# Patient Record
Sex: Female | Born: 1975 | Race: Black or African American | Hispanic: No | Marital: Single | Smoking: Current every day smoker
Health system: Southern US, Community
[De-identification: ages and names within clinical notes are randomized; demographics above are authoritative.]

## PROBLEM LIST (undated history)

## (undated) ENCOUNTER — Ambulatory Visit (HOSPITAL_COMMUNITY): Source: Home / Self Care

## (undated) DIAGNOSIS — J45909 Unspecified asthma, uncomplicated: Secondary | ICD-10-CM

## (undated) DIAGNOSIS — M1990 Unspecified osteoarthritis, unspecified site: Secondary | ICD-10-CM

---

## 1997-10-06 ENCOUNTER — Inpatient Hospital Stay (HOSPITAL_COMMUNITY): Admission: AD | Admit: 1997-10-06 | Discharge: 1997-10-06 | Payer: Self-pay | Admitting: *Deleted

## 1997-10-11 ENCOUNTER — Ambulatory Visit (HOSPITAL_COMMUNITY): Admission: RE | Admit: 1997-10-11 | Discharge: 1997-10-11 | Payer: Self-pay | Admitting: Obstetrics & Gynecology

## 1997-12-29 ENCOUNTER — Inpatient Hospital Stay (HOSPITAL_COMMUNITY): Admission: AD | Admit: 1997-12-29 | Discharge: 1997-12-29 | Payer: Self-pay | Admitting: Obstetrics & Gynecology

## 1998-01-18 ENCOUNTER — Emergency Department (HOSPITAL_COMMUNITY): Admission: EM | Admit: 1998-01-18 | Discharge: 1998-01-18 | Payer: Self-pay | Admitting: Emergency Medicine

## 1998-01-18 ENCOUNTER — Inpatient Hospital Stay (HOSPITAL_COMMUNITY): Admission: AD | Admit: 1998-01-18 | Discharge: 1998-01-18 | Payer: Self-pay | Admitting: Obstetrics

## 1998-04-26 ENCOUNTER — Emergency Department (HOSPITAL_COMMUNITY): Admission: EM | Admit: 1998-04-26 | Discharge: 1998-04-26 | Payer: Self-pay | Admitting: Emergency Medicine

## 1998-06-14 ENCOUNTER — Inpatient Hospital Stay (HOSPITAL_COMMUNITY): Admission: AD | Admit: 1998-06-14 | Discharge: 1998-06-14 | Payer: Self-pay | Admitting: Obstetrics

## 1998-07-10 ENCOUNTER — Ambulatory Visit (HOSPITAL_COMMUNITY): Admission: RE | Admit: 1998-07-10 | Discharge: 1998-07-10 | Payer: Self-pay | Admitting: Obstetrics

## 1998-08-14 ENCOUNTER — Inpatient Hospital Stay (HOSPITAL_COMMUNITY): Admission: AD | Admit: 1998-08-14 | Discharge: 1998-08-14 | Payer: Self-pay | Admitting: *Deleted

## 1998-09-06 ENCOUNTER — Ambulatory Visit (HOSPITAL_COMMUNITY): Admission: RE | Admit: 1998-09-06 | Discharge: 1998-09-06 | Payer: Self-pay | Admitting: *Deleted

## 1998-10-16 ENCOUNTER — Inpatient Hospital Stay (HOSPITAL_COMMUNITY): Admission: AD | Admit: 1998-10-16 | Discharge: 1998-10-16 | Payer: Self-pay | Admitting: Obstetrics & Gynecology

## 1999-01-05 ENCOUNTER — Inpatient Hospital Stay (HOSPITAL_COMMUNITY): Admission: RE | Admit: 1999-01-05 | Discharge: 1999-01-05 | Payer: Self-pay | Admitting: *Deleted

## 1999-01-07 ENCOUNTER — Inpatient Hospital Stay (HOSPITAL_COMMUNITY): Admission: AD | Admit: 1999-01-07 | Discharge: 1999-01-07 | Payer: Self-pay | Admitting: Obstetrics

## 1999-01-08 ENCOUNTER — Encounter (INDEPENDENT_AMBULATORY_CARE_PROVIDER_SITE_OTHER): Payer: Self-pay | Admitting: Specialist

## 1999-01-08 ENCOUNTER — Inpatient Hospital Stay (HOSPITAL_COMMUNITY): Admission: AD | Admit: 1999-01-08 | Discharge: 1999-01-12 | Payer: Self-pay | Admitting: Obstetrics

## 1999-01-16 ENCOUNTER — Inpatient Hospital Stay (HOSPITAL_COMMUNITY): Admission: AD | Admit: 1999-01-16 | Discharge: 1999-01-16 | Payer: Self-pay | Admitting: *Deleted

## 1999-10-15 ENCOUNTER — Emergency Department (HOSPITAL_COMMUNITY): Admission: EM | Admit: 1999-10-15 | Discharge: 1999-10-15 | Payer: Self-pay | Admitting: Emergency Medicine

## 1999-10-15 ENCOUNTER — Encounter: Payer: Self-pay | Admitting: Emergency Medicine

## 2001-12-14 ENCOUNTER — Emergency Department (HOSPITAL_COMMUNITY): Admission: EM | Admit: 2001-12-14 | Discharge: 2001-12-14 | Payer: Self-pay | Admitting: Emergency Medicine

## 2002-07-13 ENCOUNTER — Emergency Department (HOSPITAL_COMMUNITY): Admission: EM | Admit: 2002-07-13 | Discharge: 2002-07-13 | Payer: Self-pay | Admitting: Emergency Medicine

## 2004-08-02 ENCOUNTER — Emergency Department (HOSPITAL_COMMUNITY): Admission: EM | Admit: 2004-08-02 | Discharge: 2004-08-02 | Payer: Self-pay | Admitting: Emergency Medicine

## 2007-04-30 ENCOUNTER — Emergency Department (HOSPITAL_COMMUNITY): Admission: EM | Admit: 2007-04-30 | Discharge: 2007-04-30 | Payer: Self-pay | Admitting: Emergency Medicine

## 2008-05-19 ENCOUNTER — Emergency Department (HOSPITAL_COMMUNITY): Admission: EM | Admit: 2008-05-19 | Discharge: 2008-05-19 | Payer: Self-pay | Admitting: Emergency Medicine

## 2008-06-29 ENCOUNTER — Inpatient Hospital Stay (HOSPITAL_COMMUNITY): Admission: AD | Admit: 2008-06-29 | Discharge: 2008-06-29 | Payer: Self-pay | Admitting: Obstetrics & Gynecology

## 2008-08-26 ENCOUNTER — Ambulatory Visit (HOSPITAL_COMMUNITY): Admission: RE | Admit: 2008-08-26 | Discharge: 2008-08-26 | Payer: Self-pay | Admitting: Obstetrics

## 2008-09-29 ENCOUNTER — Inpatient Hospital Stay (HOSPITAL_COMMUNITY): Admission: AD | Admit: 2008-09-29 | Discharge: 2008-09-29 | Payer: Self-pay | Admitting: Obstetrics

## 2008-09-29 ENCOUNTER — Ambulatory Visit: Payer: Self-pay | Admitting: Obstetrics and Gynecology

## 2008-11-06 ENCOUNTER — Inpatient Hospital Stay (HOSPITAL_COMMUNITY): Admission: AD | Admit: 2008-11-06 | Discharge: 2008-11-06 | Payer: Self-pay | Admitting: Obstetrics

## 2008-11-06 ENCOUNTER — Ambulatory Visit: Payer: Self-pay | Admitting: Advanced Practice Midwife

## 2008-12-19 ENCOUNTER — Inpatient Hospital Stay (HOSPITAL_COMMUNITY): Admission: AD | Admit: 2008-12-19 | Discharge: 2008-12-20 | Payer: Self-pay | Admitting: Obstetrics

## 2009-01-19 ENCOUNTER — Inpatient Hospital Stay (HOSPITAL_COMMUNITY): Admission: AD | Admit: 2009-01-19 | Discharge: 2009-01-21 | Payer: Self-pay | Admitting: Obstetrics

## 2009-01-30 ENCOUNTER — Inpatient Hospital Stay (HOSPITAL_COMMUNITY): Admission: AD | Admit: 2009-01-30 | Discharge: 2009-02-02 | Payer: Self-pay | Admitting: Obstetrics

## 2010-10-28 LAB — COMPREHENSIVE METABOLIC PANEL
ALT: 11 U/L (ref 0–35)
AST: 15 U/L (ref 0–37)
AST: 17 U/L (ref 0–37)
AST: 18 U/L (ref 0–37)
Albumin: 3 g/dL — ABNORMAL LOW (ref 3.5–5.2)
Albumin: 3.1 g/dL — ABNORMAL LOW (ref 3.5–5.2)
Albumin: 3.2 g/dL — ABNORMAL LOW (ref 3.5–5.2)
Alkaline Phosphatase: 157 U/L — ABNORMAL HIGH (ref 39–117)
Alkaline Phosphatase: 85 U/L (ref 39–117)
BUN: 11 mg/dL (ref 6–23)
BUN: 4 mg/dL — ABNORMAL LOW (ref 6–23)
CO2: 24 mEq/L (ref 19–32)
CO2: 26 mEq/L (ref 19–32)
Calcium: 8.6 mg/dL (ref 8.4–10.5)
Calcium: 8.7 mg/dL (ref 8.4–10.5)
Chloride: 105 mEq/L (ref 96–112)
Chloride: 105 mEq/L (ref 96–112)
Creatinine, Ser: 0.43 mg/dL (ref 0.4–1.2)
Creatinine, Ser: 0.63 mg/dL (ref 0.4–1.2)
Creatinine, Ser: 0.72 mg/dL (ref 0.4–1.2)
GFR calc Af Amer: >60 mL/min (ref >60)
GFR calc Af Amer: >60 mL/min (ref >60)
GFR calc Af Amer: >60 mL/min (ref >60)
GFR calc non Af Amer: >60 mL/min (ref >60)
GFR calc non Af Amer: >60 mL/min (ref >60)
GFR calc non Af Amer: >60 mL/min (ref >60)
Glucose, Bld: 88 mg/dL (ref 70–99)
Potassium: 2.9 mEq/L — ABNORMAL LOW (ref 3.5–5.1)
Potassium: 3.1 mEq/L — ABNORMAL LOW (ref 3.5–5.1)
Sodium: 138 mEq/L (ref 135–145)
Total Bilirubin: 0.5 mg/dL (ref 0.3–1.2)
Total Bilirubin: 0.5 mg/dL (ref 0.3–1.2)
Total Protein: 6.6 g/dL (ref 6.0–8.3)

## 2010-10-28 LAB — CBC
HCT: 28.9 % — ABNORMAL LOW (ref 36.0–46.0)
HCT: 31.2 % — ABNORMAL LOW (ref 36.0–46.0)
HCT: 31.3 % — ABNORMAL LOW (ref 36.0–46.0)
Hemoglobin: 10.7 g/dL — ABNORMAL LOW (ref 12.0–15.0)
Hemoglobin: 9.7 g/dL — ABNORMAL LOW (ref 12.0–15.0)
MCHC: 33.1 g/dL (ref 30.0–36.0)
MCHC: 33.8 g/dL (ref 30.0–36.0)
MCHC: 34.2 g/dL (ref 30.0–36.0)
MCV: 85.8 fL (ref 78.0–100.0)
MCV: 85.8 fL (ref 78.0–100.0)
MCV: 86.2 fL (ref 78.0–100.0)
MCV: 87 fL (ref 78.0–100.0)
Platelets: 269 10*3/uL (ref 150–400)
Platelets: 547 10*3/uL — ABNORMAL HIGH (ref 150–400)
RBC: 3.64 MIL/uL — ABNORMAL LOW (ref 3.87–5.11)
RBC: 3.65 MIL/uL — ABNORMAL LOW (ref 3.87–5.11)
RDW: 14.7 % (ref 11.5–15.5)
RDW: 14.7 % (ref 11.5–15.5)
WBC: 7.8 10*3/uL (ref 4.0–10.5)
WBC: 9.9 10*3/uL (ref 4.0–10.5)

## 2010-10-28 LAB — URIC ACID
Uric Acid, Serum: 4 mg/dL (ref 2.4–7.0)
Uric Acid, Serum: 6 mg/dL (ref 2.4–7.0)
Uric Acid, Serum: 6.5 mg/dL (ref 2.4–7.0)

## 2010-10-31 LAB — URINALYSIS, ROUTINE W REFLEX MICROSCOPIC
Bilirubin Urine: NEGATIVE
Glucose, UA: NEGATIVE mg/dL
Hgb urine dipstick: NEGATIVE
Ketones, ur: NEGATIVE mg/dL
Nitrite: POSITIVE — AB
Specific Gravity, Urine: 1.01 (ref 1.005–1.030)
pH: 7 (ref 5.0–8.0)

## 2010-10-31 LAB — URINE CULTURE: Colony Count: >=100000

## 2010-10-31 LAB — URINE MICROSCOPIC-ADD ON

## 2010-10-31 LAB — GC/CHLAMYDIA PROBE AMP, GENITAL
Chlamydia, DNA Probe: NEGATIVE
GC Probe Amp, Genital: NEGATIVE

## 2010-10-31 LAB — WET PREP, GENITAL: Trich, Wet Prep: NONE SEEN

## 2010-12-04 NOTE — Discharge Summary (Signed)
NAMEBRYAR, Stephanie             ACCOUNT NO.:  [id number]   MEDICAL RECORD NO.:  [id number]          PATIENT TYPE:  INP   LOCATION:  9124                          FACILITY:  [facility]   PHYSICIAN:  Kathreen Cosier, M.D.DATE OF BIRTH:  [birth year]   DATE OF ADMISSION:  01/19/2009  DATE OF DISCHARGE:  01/21/2009                               DISCHARGE SUMMARY   The patient is a 35 year old gravida 5, para 2-0-2-2, [facility] January 19, 2009.  She had previous C-section and negative GBS.  She was admitted in labor  and she was 6 cm, 90% vertex, -2 to -3.  She underwent a repeat low  transverse cesarean section because of failure to progress in labor and  had a female Apgar of 9 and 9, weighing 7 pounds from the OP position.  Fluid was meconium stained.  Postoperatively, she did well and on  admission hemoglobin 10.7, postop 9.7, platelets 269, 247.  RPR  negative.  HIV negative.  The patient wanted early discharge and was  discharged on the second postoperative day, ambulatory on a regular  diet, to see me in 6 weeks.   DISCHARGE DIAGNOSIS:  Status post repeat low-transverse cesarean section  at term for failure to progress in labor.           ______________________________  Kathreen Cosier, M.D.     BAM/MEDQ  D:  02/01/2009  T:  02/01/2009  Job:  [id number]

## 2010-12-04 NOTE — H&P (Signed)
NAMECICILY, Stephanie Richards             ACCOUNT NO.:  [id number]   MEDICAL RECORD NO.:  [id number]          PATIENT TYPE:  INP   LOCATION:  9124                          FACILITY:  [facility]   PHYSICIAN:  Kathreen Cosier, M.D.DATE OF BIRTH:  [birth year]   DATE OF ADMISSION:  01/19/2009  DATE OF DISCHARGE:                              HISTORY & PHYSICAL   The patient is a 35 year old gravida 5, para 2-0-2-2, [facility] is January 19, 2009, had a C-section for the number 2 pregnancy, negative GBS.  She was  admitted in labor.  At 9 a.m., cervix was 6 cm, 90% vertex, -2, -3.  Amniotomy was performed.  Fluid was meconium stained.  An IUPC was  inserted.  The patient was inadequate labor throughout the course of the  day.  By 6:35 p.m., her cervical findings had been unchanged.  It was  decided she will deliver by a repeat C-section for failure to progress  in labor.   PHYSICAL EXAMINATION:  GENERAL:  A well-developed female, in labor.  HEENT:  Negative.  LUNGS:  Clear.  HEART:  Regular rhythm.  No murmurs, no gallops.  BREASTS:  No masses.  ABDOMEN:  Term-sized uterus.  Estimated fetal weight of 7 pounds.  PELVIC:  As described above.  EXTREMITIES:  Negative.           ______________________________  Kathreen Cosier, M.D.     BAM/MEDQ  D:  01/19/2009  T:  01/20/2009  Job:  [id number]

## 2010-12-04 NOTE — Op Note (Signed)
Stephanie Richards, Stephanie Richards             ACCOUNT NO.:  [id number]   MEDICAL RECORD NO.:  [id number]          PATIENT TYPE:  INP   LOCATION:  9124                          FACILITY:  [facility]   PHYSICIAN:  Kathreen Cosier, M.D.DATE OF BIRTH:  1975-11-17   DATE OF PROCEDURE:  01/19/2009  DATE OF DISCHARGE:                               OPERATIVE REPORT   PREOPERATIVE DIAGNOSES:  Previous cesarean section at term and labor,  failure to progress in labor, repeat low-transverse cesarean section.   POSTOPERATIVE DIAGNOSES:  Previous cesarean section at term and labor,  failure to progress with labor, repeat low-transverse cesarean section.   SURGEON:  Kathreen Cosier, MD   ANESTHESIA:  Epidural.   PROCEDURE:  The patient placed in the operating table in the supine  position.  Abdomen prepped and draped.  Bladder emptied with the Foley  catheter.  Transverse suprapubic incision made through old scar, carried  down to rectus fascia.  Fascia cleaned and incised to the length of the  incision.  Recti muscles were retracted laterally.  Peritoneum was  incised longitudinally.  Transverse incision was made in the visceral  peritoneum above the bladder.  Bladder mobilized inferiorly.  Transverse  lower uterine incision was made.  The patient delivered from the OP  position of a female.  Apgar 9 and 9, weighing 7 pounds.  The team was  in attendance.  The fluid was meconium stained.  The placenta was  posterior and removed manually.  The uterine cavity cleaned with dry  laps. The uterine incision closed in 1 layer with continuous suture of  #1 Chromic.  Hemostasis was satisfactory.  Bladder flap reattached with  2-0 Chromic.  Uterus were contracted.  Tubes and ovaries were normal.  Abdomen was closed in layers.  Peritoneum continuous suture of 0  chromic, fascia continuous suture of 0 Dexon, and the skin closed with  subcuticular stitch of 4-0 Monocryl.  Blood loss 500 cc.  The patient  tolerated  the procedure well, and taken to the recovery room in good  condition.           ______________________________  Kathreen Cosier, M.D.     BAM/MEDQ  D:  01/19/2009  T:  01/20/2009  Job:  [id number]

## 2011-04-26 LAB — URINALYSIS, ROUTINE W REFLEX MICROSCOPIC
Bilirubin Urine: NEGATIVE
Bilirubin Urine: NEGATIVE
Hgb urine dipstick: NEGATIVE
Nitrite: POSITIVE — AB
Nitrite: POSITIVE — AB
Protein, ur: NEGATIVE mg/dL
Specific Gravity, Urine: 1.01 (ref 1.005–1.030)
Specific Gravity, Urine: 1.015 (ref 1.005–1.030)
Urobilinogen, UA: 0.2 mg/dL (ref 0.0–1.0)
Urobilinogen, UA: 0.2 mg/dL (ref 0.0–1.0)
pH: 7 (ref 5.0–8.0)

## 2011-04-26 LAB — URINE MICROSCOPIC-ADD ON

## 2011-04-26 LAB — WET PREP, GENITAL: Yeast Wet Prep HPF POC: NONE SEEN

## 2011-04-26 LAB — URINE CULTURE

## 2011-04-26 LAB — CBC
Hemoglobin: 11.4 g/dL — ABNORMAL LOW (ref 12.0–15.0)
MCHC: 33.8 g/dL (ref 30.0–36.0)
RBC: 3.87 MIL/uL (ref 3.87–5.11)
WBC: 7.4 10*3/uL (ref 4.0–10.5)

## 2011-04-26 LAB — RPR: RPR Ser Ql: NONREACTIVE

## 2011-04-26 LAB — POCT PREGNANCY, URINE: Preg Test, Ur: POSITIVE

## 2011-04-26 LAB — HCG, QUANTITATIVE, PREGNANCY: hCG, Beta Chain, Quant, S: 91079 m[IU]/mL — ABNORMAL HIGH (ref <5)

## 2011-04-26 LAB — ABO/RH: ABO/RH(D): AB POS

## 2011-05-02 LAB — POCT URINALYSIS DIP (DEVICE)
Glucose, UA: NEGATIVE
Ketones, ur: NEGATIVE
Operator id: 239701
Protein, ur: NEGATIVE
Urobilinogen, UA: 0.2

## 2011-05-02 LAB — POCT PREGNANCY, URINE
Operator id: 247071
Preg Test, Ur: NEGATIVE

## 2011-11-05 ENCOUNTER — Other Ambulatory Visit: Payer: Self-pay | Admitting: Obstetrics

## 2012-11-07 ENCOUNTER — Emergency Department (HOSPITAL_COMMUNITY)
Admission: EM | Admit: 2012-11-07 | Discharge: 2012-11-07 | Disposition: A | Discharge status: Home / Self Care | Payer: No Typology Code available for payment source | Attending: Emergency Medicine | Admitting: Emergency Medicine

## 2012-11-07 ENCOUNTER — Encounter (HOSPITAL_COMMUNITY): Payer: Self-pay | Admitting: Emergency Medicine

## 2012-11-07 DIAGNOSIS — IMO0002 Reserved for concepts with insufficient information to code with codable children: Secondary | ICD-10-CM | POA: Insufficient documentation

## 2012-11-07 DIAGNOSIS — Y92410 Unspecified street and highway as the place of occurrence of the external cause: Secondary | ICD-10-CM | POA: Insufficient documentation

## 2012-11-07 DIAGNOSIS — F17200 Nicotine dependence, unspecified, uncomplicated: Secondary | ICD-10-CM | POA: Insufficient documentation

## 2012-11-07 DIAGNOSIS — S39012A Strain of muscle, fascia and tendon of lower back, initial encounter: Secondary | ICD-10-CM

## 2012-11-07 DIAGNOSIS — Y9389 Activity, other specified: Secondary | ICD-10-CM | POA: Insufficient documentation

## 2012-11-07 MED ORDER — CYCLOBENZAPRINE HCL 10 MG PO TABS: 10.0000 mg | ORAL_TABLET | Freq: Three times a day (TID) | ORAL | Status: DC | PRN

## 2012-11-07 MED ORDER — NAPROXEN 375 MG PO TABS: 375.0000 mg | ORAL_TABLET | Freq: Two times a day (BID) | ORAL | Status: DC | PRN

## 2012-11-07 MED ORDER — IBUPROFEN 200 MG PO TABS
600.0000 mg | ORAL_TABLET | Freq: Once | ORAL | Status: AC
Start: 2012-11-07 — End: 2012-11-07
  Administered 2012-11-07: 600 mg via ORAL
  Filled 2012-11-07: qty 3
  Filled 2012-11-07: qty 1

## 2012-11-07 NOTE — ED Notes (Signed)
One 200-mg tab of ibuprofen dropped on the floor. Went to med room and got another one 200-mg tab.
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
## 2012-11-27 ENCOUNTER — Emergency Department (HOSPITAL_COMMUNITY)
Admission: EM | Admit: 2012-11-27 | Discharge: 2012-11-28 | Disposition: A | Discharge status: Home / Self Care | Payer: Medicaid Other | Attending: Emergency Medicine | Admitting: Emergency Medicine

## 2012-11-27 ENCOUNTER — Encounter (HOSPITAL_COMMUNITY): Payer: Self-pay

## 2012-11-27 DIAGNOSIS — R509 Fever, unspecified: Secondary | ICD-10-CM | POA: Insufficient documentation

## 2012-11-27 DIAGNOSIS — R1310 Dysphagia, unspecified: Secondary | ICD-10-CM | POA: Insufficient documentation

## 2012-11-27 DIAGNOSIS — IMO0001 Reserved for inherently not codable concepts without codable children: Secondary | ICD-10-CM | POA: Insufficient documentation

## 2012-11-27 DIAGNOSIS — F17200 Nicotine dependence, unspecified, uncomplicated: Secondary | ICD-10-CM | POA: Insufficient documentation

## 2012-11-27 DIAGNOSIS — J020 Streptococcal pharyngitis: Secondary | ICD-10-CM | POA: Insufficient documentation

## 2012-11-27 LAB — RAPID STREP SCREEN (MED CTR MEBANE ONLY): Streptococcus, Group A Screen (Direct): POSITIVE — AB

## 2012-11-27 MED ORDER — PENICILLIN G BENZATHINE 1200000 UNIT/2ML IM SUSP
1.2000 10*6.[IU] | Freq: Once | INTRAMUSCULAR | Status: AC
  Administered 2012-11-27: 1.2 10*6.[IU] via INTRAMUSCULAR
  Filled 2012-11-27: qty 2

## 2012-11-27 MED ORDER — IBUPROFEN 600 MG PO TABS: 600.0000 mg | ORAL_TABLET | Freq: Four times a day (QID) | ORAL | Status: DC | PRN

## 2012-11-27 MED ORDER — ACETAMINOPHEN 325 MG PO TABS
650.0000 mg | ORAL_TABLET | Freq: Once | ORAL | Status: AC
  Administered 2012-11-27: 650 mg via ORAL
  Filled 2012-11-27: qty 2

## 2012-11-27 NOTE — ED Provider Notes (Signed)
History     CSN: [id number]  Arrival date & time 11/27/12  2129   First MD Initiated Contact with Patient 11/27/12 2255      Chief Complaint  Patient presents with  . Sore Throat    (Consider location/radiation/quality/duration/timing/severity/associated sxs/prior treatment) HPI Comments: Patient started with sore throat, myalgias, and fever.  Yesterday.  She has been in known contact with positive strep.  She is taking over-the-counter Tylenol with little relief  Patient is a 37 y.o. female presenting with pharyngitis. The history is provided by the patient.  Sore Throat This is a new problem. The current episode started yesterday. The problem has been unchanged. Associated symptoms include a fever, myalgias and a sore throat. Pertinent negatives include no chills, headaches, nausea or neck pain. The symptoms are aggravated by swallowing. She has tried nothing for the symptoms.    History reviewed. No pertinent past medical history.  History reviewed. No pertinent past surgical history.  History reviewed. No pertinent family history.  History  Substance Use Topics  . Smoking status: Current Every Day Smoker -- 0.50 packs/day    Types: Cigarettes  . Smokeless tobacco: Not on file  . Alcohol Use: Yes    OB History   Grav Para Term Preterm Abortions TAB SAB Ect Mult Living                  Review of Systems  Constitutional: Positive for fever. Negative for chills.  HENT: Positive for sore throat and trouble swallowing. Negative for rhinorrhea and neck pain.   Gastrointestinal: Negative for nausea.  Musculoskeletal: Positive for myalgias.  Neurological: Negative for dizziness and headaches.  All other systems reviewed and are negative.    Allergies  Review of patient's allergies indicates no known allergies.  Home Medications   Current Outpatient Rx  Name  Route  Sig  Dispense  Refill  . cyclobenzaprine (FLEXERIL) 10 MG tablet   Oral   Take 1 tablet (10 mg  total) by mouth 3 (three) times daily as needed for muscle spasms.   12 tablet   0   . MedroxyPROGESTERone Acetate (DEPO-PROVERA IM)   Intramuscular   Inject into the muscle every 3 (three) months.         Marland Kitchen ibuprofen (ADVIL,MOTRIN) 600 MG tablet   Oral   Take 1 tablet (600 mg total) by mouth every 6 (six) hours as needed for pain.   30 tablet   0     BP 114/61  Pulse 104  Temp(Src) 101.8 F (38.8 C) (Oral)  Resp 20  SpO2 96%  Physical Exam  Vitals reviewed. Constitutional: She is oriented to person, place, and time. She appears well-developed and well-nourished.  HENT:  Head: Normocephalic and atraumatic.  Right Ear: External ear normal.  Left Ear: External ear normal.  Mouth/Throat: Edematous present. Oropharyngeal exudate and posterior oropharyngeal erythema present. No tonsillar abscesses.  Eyes: Pupils are equal, round, and reactive to light.  Cardiovascular: Regular rhythm.  Tachycardia present.   Pulmonary/Chest: Effort normal and breath sounds normal.  Musculoskeletal: Normal range of motion.  Lymphadenopathy:    She has cervical adenopathy.  Neurological: She is alert and oriented to person, place, and time.  Skin: Skin is warm and dry. No rash noted.    ED Course  Procedures (including critical care time)  Labs Reviewed  RAPID STREP SCREEN - Abnormal; Notable for the following:    Streptococcus, Group A Screen (Direct) POSITIVE (*)    All other components within  normal limits   No results found.   1. Strep pharyngitis       MDM  Patient.  Positive for strep, be given IM injection of 1.2 million units of LA Bicillin, recheck, temperature       Arman Filter, NP 11/27/12 2348  Arman Filter, NP 11/27/12 2348

## 2012-11-27 NOTE — ED Notes (Signed)
Pt c/o sore throat starting last night, getting worse through out the day, increase pain w/swallowing

## 2012-11-28 ENCOUNTER — Emergency Department (HOSPITAL_COMMUNITY)
Admission: EM | Admit: 2012-11-28 | Discharge: 2012-11-29 | Disposition: A | Discharge status: Home / Self Care | Payer: Medicaid Other | Attending: Emergency Medicine | Admitting: Emergency Medicine

## 2012-11-28 DIAGNOSIS — J020 Streptococcal pharyngitis: Secondary | ICD-10-CM | POA: Insufficient documentation

## 2012-11-28 DIAGNOSIS — R599 Enlarged lymph nodes, unspecified: Secondary | ICD-10-CM | POA: Insufficient documentation

## 2012-11-28 DIAGNOSIS — F17200 Nicotine dependence, unspecified, uncomplicated: Secondary | ICD-10-CM | POA: Insufficient documentation

## 2012-11-28 DIAGNOSIS — R000 Tachycardia, unspecified: Secondary | ICD-10-CM | POA: Insufficient documentation

## 2012-11-28 DIAGNOSIS — Z79899 Other long term (current) drug therapy: Secondary | ICD-10-CM | POA: Insufficient documentation

## 2012-11-28 MED ORDER — SODIUM CHLORIDE 0.9 % IV BOLUS (SEPSIS)
1000.0000 mL | Freq: Once | INTRAVENOUS | Status: AC
  Administered 2012-11-28: 1000 mL via INTRAVENOUS

## 2012-11-28 MED ORDER — LIDOCAINE VISCOUS 2 % MT SOLN
20.0000 mL | Freq: Once | OROMUCOSAL | Status: AC
  Administered 2012-11-28: 20 mL via OROMUCOSAL
  Filled 2012-11-28: qty 15

## 2012-11-28 MED ORDER — KETOROLAC TROMETHAMINE 30 MG/ML IJ SOLN
30.0000 mg | Freq: Once | INTRAMUSCULAR | Status: AC
  Administered 2012-11-29: 30 mg via INTRAVENOUS
  Filled 2012-11-28: qty 1

## 2012-11-28 MED ORDER — GI COCKTAIL ~~LOC~~
30.0000 mL | Freq: Once | ORAL | Status: DC
  Filled 2012-11-28: qty 30

## 2012-11-28 MED ORDER — HYDROCODONE-ACETAMINOPHEN 7.5-325 MG/15ML PO SOLN: 15.0000 mL | Freq: Four times a day (QID) | ORAL | Status: DC | PRN

## 2012-11-28 MED ORDER — DIPHENHYDRAMINE HCL 12.5 MG/5ML PO ELIX
25.0000 mg | ORAL_SOLUTION | Freq: Once | ORAL | Status: DC
  Filled 2012-11-28: qty 10

## 2012-11-28 NOTE — ED Notes (Addendum)
Pt states symptoms are the same as yesterday, received prescriptions, has not gotten them filled. Pt has trouble speaking and swallowing.

## 2012-11-28 NOTE — ED Provider Notes (Signed)
  Medical screening examination/treatment/procedure(s) were performed by non-physician practitioner and as supervising physician I was immediately available for consultation/collaboration.    Gerhard Munch, MD 11/28/12 [phone]

## 2012-11-28 NOTE — ED Provider Notes (Signed)
History     CSN: [id number]  Arrival date & time 11/28/12  2139   First MD Initiated Contact with Patient 11/28/12 2155      Chief Complaint  Patient presents with  . Sore Throat   HPI  History provided by the patient and recent medical chart. Patient is a 37 year old female who presents with continued sore throat symptoms. She was seen and evaluated yesterday for similar symptoms. Sore throat began 1-2 days ago. She was found to have a strep throat infection and given a long-acting dose of penicillin IM for treatment yesterday. Since that time she has not taken any other medications for symptoms and complains of continued pain. Pain is worse with any needing or drinking. He is having a difficult time swallowing. Denies any difficulty breathing. No cough symptoms. Denies fever, chills or sweats.    No past medical history on file.  No past surgical history on file.  No family history on file.  History  Substance Use Topics  . Smoking status: Current Every Day Smoker -- 0.50 packs/day    Types: Cigarettes  . Smokeless tobacco: Not on file  . Alcohol Use: Yes    OB History   Grav Para Term Preterm Abortions TAB SAB Ect Mult Living                  Review of Systems  Constitutional: Negative for fever, chills and diaphoresis.  HENT: Positive for sore throat.   Respiratory: Negative for cough.   All other systems reviewed and are negative.    Allergies  Review of patient's allergies indicates no known allergies.  Home Medications   Current Outpatient Rx  Name  Route  Sig  Dispense  Refill  . cyclobenzaprine (FLEXERIL) 10 MG tablet   Oral   Take 1 tablet (10 mg total) by mouth 3 (three) times daily as needed for muscle spasms.   12 tablet   0   . ibuprofen (ADVIL,MOTRIN) 600 MG tablet   Oral   Take 1 tablet (600 mg total) by mouth every 6 (six) hours as needed for pain.   30 tablet   0   . MedroxyPROGESTERone Acetate (DEPO-PROVERA IM)   Intramuscular  Inject into the muscle every 3 (three) months.           BP 113/73  Pulse 112  Temp(Src) 99.7 F (37.6 C) (Oral)  Resp 20  SpO2 96%  Physical Exam  Nursing note and vitals reviewed. Constitutional: She is oriented to person, place, and time. She appears well-developed and well-nourished. No distress.  HENT:  Head: Normocephalic and atraumatic. No trismus in the jaw.  Right Ear: External ear normal.  Left Ear: External ear normal.  Mouth/Throat: Oropharyngeal exudate and posterior oropharyngeal erythema present. No tonsillar abscesses.  Tonsils 4+ bilaterally with exudate. Uvula midline. No signs concerning for PTA.  Eyes: Pupils are equal, round, and reactive to light.  Cardiovascular: Regular rhythm.  Tachycardia present.   Pulmonary/Chest: Effort normal and breath sounds normal. No respiratory distress. She has no wheezes. She has no rales.  Abdominal: Soft.  Musculoskeletal: Normal range of motion.  Lymphadenopathy:    She has cervical adenopathy.  Neurological: She is alert and oriented to person, place, and time.  Skin: Skin is warm and dry. No rash noted.  Psychiatric: She has a normal mood and affect. Her behavior is normal.    ED Course  Procedures     1. Strep throat  MDM  10:10 PM patient seen and evaluated. Patient refusing to take any liquid medications or by mouth challenge. Complains of too much pain in throat. She is slightly tachycardic with poor by mouth intake today. Will give IV fluids.        Angus Seller, PA-C 11/28/12 2352

## 2012-11-28 NOTE — ED Provider Notes (Signed)
Medical screening examination/treatment/procedure(s) were performed by non-physician practitioner and as supervising physician I was immediately available for consultation/collaboration.  Marshea Wisher, MD 11/28/12 0012 

## 2012-11-28 NOTE — ED Notes (Signed)
Pt here for same symptoms that brought her in Friday. Sore throat, Fever, sweating, chills, lower back pain, and productive cough. Denies N/V.

## 2014-01-20 ENCOUNTER — Emergency Department (HOSPITAL_COMMUNITY): Payer: Medicaid Other

## 2014-01-20 ENCOUNTER — Emergency Department (HOSPITAL_COMMUNITY)
Admission: EM | Admit: 2014-01-20 | Discharge: 2014-01-20 | Disposition: A | Discharge status: Home / Self Care | Payer: Self-pay | Attending: Emergency Medicine | Admitting: Emergency Medicine

## 2014-01-20 ENCOUNTER — Encounter (HOSPITAL_COMMUNITY): Payer: Self-pay | Admitting: Emergency Medicine

## 2014-01-20 DIAGNOSIS — Z79899 Other long term (current) drug therapy: Secondary | ICD-10-CM | POA: Insufficient documentation

## 2014-01-20 DIAGNOSIS — F17200 Nicotine dependence, unspecified, uncomplicated: Secondary | ICD-10-CM | POA: Insufficient documentation

## 2014-01-20 DIAGNOSIS — J020 Streptococcal pharyngitis: Secondary | ICD-10-CM | POA: Insufficient documentation

## 2014-01-20 LAB — CBC WITH DIFFERENTIAL/PLATELET
BASOS PCT: 0 % (ref 0–1)
Basophils Absolute: 0 10*3/uL (ref 0.0–0.1)
EOS PCT: 0 % (ref 0–5)
Eosinophils Absolute: 0 10*3/uL (ref 0.0–0.7)
HCT: 38.7 % (ref 36.0–46.0)
HEMOGLOBIN: 13 g/dL (ref 12.0–15.0)
LYMPHS PCT: 8 % — AB (ref 12–46)
Lymphs Abs: 1.7 10*3/uL (ref 0.7–4.0)
MCH: 28.2 pg (ref 26.0–34.0)
MCHC: 33.6 g/dL (ref 30.0–36.0)
MCV: 83.9 fL (ref 78.0–100.0)
MONO ABS: 1.9 10*3/uL — AB (ref 0.1–1.0)
MONOS PCT: 9 % (ref 3–12)
NEUTROS PCT: 83 % — AB (ref 43–77)
Neutro Abs: 17.2 10*3/uL — ABNORMAL HIGH (ref 1.7–7.7)
Platelets: 277 10*3/uL (ref 150–400)
RBC: 4.61 MIL/uL (ref 3.87–5.11)
RDW: 14 % (ref 11.5–15.5)
WBC: 20.8 10*3/uL — AB (ref 4.0–10.5)

## 2014-01-20 LAB — BASIC METABOLIC PANEL
ANION GAP: 19 — AB (ref 5–15)
BUN: 10 mg/dL (ref 6–23)
CHLORIDE: 97 meq/L (ref 96–112)
CO2: 23 meq/L (ref 19–32)
Calcium: 9.2 mg/dL (ref 8.4–10.5)
Creatinine, Ser: 0.81 mg/dL (ref 0.50–1.10)
GFR calc Af Amer: >90 mL/min (ref >90)
GFR calc non Af Amer: >90 mL/min (ref >90)
GLUCOSE: 103 mg/dL — AB (ref 70–99)
POTASSIUM: 3.5 meq/L — AB (ref 3.7–5.3)
SODIUM: 139 meq/L (ref 137–147)

## 2014-01-20 LAB — RAPID STREP SCREEN (MED CTR MEBANE ONLY): Streptococcus, Group A Screen (Direct): POSITIVE — AB

## 2014-01-20 MED ORDER — SODIUM CHLORIDE 0.9 % IV BOLUS (SEPSIS)
1000.0000 mL | Freq: Once | INTRAVENOUS | Status: AC
  Administered 2014-01-20: 1000 mL via INTRAVENOUS

## 2014-01-20 MED ORDER — IOHEXOL 300 MG/ML  SOLN
80.0000 mL | Freq: Once | INTRAMUSCULAR | Status: AC | PRN
  Administered 2014-01-20: 75 mL via INTRAVENOUS

## 2014-01-20 MED ORDER — HYDROCODONE-ACETAMINOPHEN 7.5-325 MG/15ML PO SOLN: 15.0000 mL | ORAL | Status: DC | PRN

## 2014-01-20 MED ORDER — KETOROLAC TROMETHAMINE 30 MG/ML IJ SOLN
30.0000 mg | Freq: Once | INTRAMUSCULAR | Status: AC
  Administered 2014-01-20: 30 mg via INTRAVENOUS
  Filled 2014-01-20: qty 1

## 2014-01-20 MED ORDER — ONDANSETRON HCL 4 MG/2ML IJ SOLN
4.0000 mg | Freq: Once | INTRAMUSCULAR | Status: AC
  Administered 2014-01-20: 4 mg via INTRAVENOUS
  Filled 2014-01-20: qty 2

## 2014-01-20 MED ORDER — PENICILLIN G BENZATHINE 1200000 UNIT/2ML IM SUSP
1.2000 10*6.[IU] | Freq: Once | INTRAMUSCULAR | Status: AC
Start: 2014-01-20 — End: 2014-01-20
  Administered 2014-01-20: 1.2 10*6.[IU] via INTRAMUSCULAR
  Filled 2014-01-20: qty 2

## 2014-01-20 MED ORDER — DEXAMETHASONE SODIUM PHOSPHATE 10 MG/ML IJ SOLN
10.0000 mg | Freq: Once | INTRAMUSCULAR | Status: AC
  Administered 2014-01-20: 10 mg via INTRAVENOUS
  Filled 2014-01-20: qty 1

## 2014-01-20 MED ORDER — MORPHINE SULFATE 4 MG/ML IJ SOLN
4.0000 mg | Freq: Once | INTRAMUSCULAR | Status: AC
  Administered 2014-01-20: 4 mg via INTRAVENOUS
  Filled 2014-01-20: qty 1

## 2014-01-20 NOTE — ED Notes (Addendum)
Pt presents with red, enlarged white coated tonsils x 2 days. Hx of tonsil infections. Pt reports difficulty swallowing dt pain. Pt reports fever/chills and generalized body aches. PT speaks in complete sentences, NAD.

## 2014-01-20 NOTE — Discharge Instructions (Signed)
You have been treated for strep throat here. Take Vicodin as needed for pain. Refer to attached documents for more information. Return to the ED with worsening or concerning symptoms. Follow up with your doctor as needed.

## 2014-01-20 NOTE — ED Provider Notes (Signed)
CSN: [id number]     Arrival date & time 01/20/14  1232 History   First MD Initiated Contact with Patient 01/20/14 1246     Chief Complaint  Patient presents with  . Sore Throat     (Consider location/radiation/quality/duration/timing/severity/associated sxs/prior Treatment) HPI Comments: Patient is a 38 year old female who presents with a 2 day history of sore throat. Patient reports gradual onset and progressively worsening sharp, severe throat pain. The pain is constant and made worse with swallowing. The pain is localized to the patient's throat and equal on both sides. Nothing alleviates the pain. The patient has not tried anything for symptom relief. Patient reports associated subjective fever, cervical adenopathy, and trouble swallowing. She reports her daughter has been sick lately as well. Patient denies headache, visual changes, sinus congestion, difficulty breathing, chest pain, SOB, abdominal pain, NVD.     Patient is a 38 y.o. female presenting with pharyngitis.  Sore Throat Associated symptoms include a fever and a sore throat. Pertinent negatives include no abdominal pain, arthralgias, chest pain, chills, fatigue, nausea, neck pain, vomiting or weakness.    History reviewed. No pertinent past medical history. History reviewed. No pertinent past surgical history. No family history on file. History  Substance Use Topics  . Smoking status: Current Every Day Smoker -- 0.50 packs/day    Types: Cigarettes  . Smokeless tobacco: Not on file  . Alcohol Use: Yes   OB History   Grav Para Term Preterm Abortions TAB SAB Ect Mult Living                 Review of Systems  Constitutional: Positive for fever. Negative for chills and fatigue.  HENT: Positive for sore throat and trouble swallowing.   Eyes: Negative for visual disturbance.  Respiratory: Negative for shortness of breath.   Cardiovascular: Negative for chest pain and palpitations.  Gastrointestinal: Negative for  nausea, vomiting, abdominal pain and diarrhea.  Genitourinary: Negative for dysuria and difficulty urinating.  Musculoskeletal: Negative for arthralgias and neck pain.  Skin: Negative for color change.  Neurological: Negative for dizziness and weakness.  Psychiatric/Behavioral: Negative for dysphoric mood.      Allergies  Review of patient's allergies indicates no known allergies.  Home Medications   Prior to Admission medications   Medication Sig Start Date End Date Taking? Authorizing Provider  cyclobenzaprine (FLEXERIL) 10 MG tablet Take 1 tablet (10 mg total) by mouth 3 (three) times daily as needed for muscle spasms. 11/07/12   Raeford RazorStephen Kohut, MD  HYDROcodone-acetaminophen (HYCET) 7.5-325 mg/15 ml solution Take 15 mLs by mouth 4 (four) times daily as needed for pain. 11/28/12   Phill MutterPeter S Dammen, PA-C  ibuprofen (ADVIL,MOTRIN) 600 MG tablet Take 1 tablet (600 mg total) by mouth every 6 (six) hours as needed for pain. 11/27/12   Arman FilterGail K Schulz, NP  MedroxyPROGESTERone Acetate (DEPO-PROVERA IM) Inject into the muscle every 3 (three) months.    Historical Provider, MD   BP 103/63  Pulse 103  Temp(Src) 98.9 F (37.2 C) (Oral)  Resp 18  SpO2 100% Physical Exam  Nursing note and vitals reviewed. Constitutional: She is oriented to person, place, and time. She appears well-developed and well-nourished. No distress.  Patient is spitting into a cup, as she is having trouble swallowing.   HENT:  Head: Normocephalic and atraumatic.  Mouth/Throat: Oropharyngeal exudate present.  Trismus noted. Bilateral tonsillar edema, erythema, and exudate.   Eyes: Conjunctivae and EOM are normal.  Neck:  Left anterior cervical adenopathy and  tenderness to palpation.   Cardiovascular: Regular rhythm.  Exam reveals no gallop and no friction rub.   No murmur heard. tachycardic  Pulmonary/Chest: Effort normal and breath sounds normal. She has no wheezes. She has no rales. She exhibits no tenderness.   Abdominal: Soft. She exhibits no distension. There is no tenderness. There is no rebound.  Musculoskeletal: Normal range of motion.  Lymphadenopathy:    She has cervical adenopathy.  Neurological: She is alert and oriented to person, place, and time. Coordination normal.  Speech is goal-oriented. Moves limbs without ataxia.   Skin: Skin is warm and dry.  Psychiatric: She has a normal mood and affect. Her behavior is normal.    ED Course  Procedures (including critical care time) Labs Review Labs Reviewed  RAPID STREP SCREEN - Abnormal; Notable for the following:    Streptococcus, Group A Screen (Direct) POSITIVE (*)    All other components within normal limits  CBC WITH DIFFERENTIAL - Abnormal; Notable for the following:    WBC 20.8 (*)    Neutrophils Relative % 83 (*)    Lymphocytes Relative 8 (*)    Neutro Abs 17.2 (*)    Monocytes Absolute 1.9 (*)    All other components within normal limits  BASIC METABOLIC PANEL - Abnormal; Notable for the following:    Potassium 3.5 (*)    Glucose, Bld 103 (*)    Anion gap 19 (*)    All other components within normal limits    Imaging Review Ct Soft Tissue Neck W Contrast  01/20/2014   CLINICAL DATA:  Enlarged, red, white coated tonsils, history of tonsillar infection, difficulty swallowing due to pain, fever/chills  EXAM: CT NECK WITH CONTRAST  TECHNIQUE: Multidetector CT imaging of the neck was performed using the standard protocol following the bolus administration of intravenous contrast.  CONTRAST:  75mL OMNIPAQUE IOHEXOL 300 MG/ML  SOLN  COMPARISON:  None.  FINDINGS: Symmetric enhancement with prominence of the bilateral tonsils. No evidence of peritonsillar abscess.  Pharyngeal soft tissues are unremarkable. Airway is mildly narrowed (series 3/image 30), but remains patent.  Prominent bilateral cervical lymph nodes, likely reactive.  Visualized thyroid is unremarkable.  Reversal of the normal cervical lordosis, likely positional.  Cervical spine is otherwise within normal limits.  Visualized lung apices are clear.  IMPRESSION: Enlargement of the bilateral tonsils. Airway is mildly narrowed but patent.  No evidence of peritonsillar abscess.   Electronically Signed   By: Charline BillsSriyesh  Krishnan M.D.   On: 01/20/2014 14:28     EKG Interpretation None      MDM   Final diagnoses:  Strep throat    12:58 PM Labs and rapid strep pending. Patient will have CT neck with contrast to rule out abscess.   4:38 PM Patient has positive strep test. Patient's CT is unremarkable for abscess. Patient has elevated WBC at 20.8. Remaining labs unremarkable for acute changes. Patient given additional fluids here and 1.2 million units bicillin. Patient will be discharged home with liquid vicodin. Patient instructed to return with worsening or concerning symptoms.   Emilia BeckKaitlyn Aja Whitehair, PA-C 01/20/14 1644

## 2014-01-21 NOTE — ED Provider Notes (Signed)
Medical screening examination/treatment/procedure(s) were performed by non-physician practitioner and as supervising physician I was immediately available for consultation/collaboration.   EKG Interpretation None        Audree CamelScott T Khira Cudmore, MD 01/21/14 1331

## 2014-12-15 ENCOUNTER — Inpatient Hospital Stay (HOSPITAL_COMMUNITY)
Admission: AD | Admit: 2014-12-15 | Discharge: 2014-12-15 | Disposition: A | Discharge status: Home / Self Care | Payer: Medicaid Other | Source: Ambulatory Visit | Attending: Obstetrics & Gynecology | Admitting: Obstetrics & Gynecology

## 2014-12-15 DIAGNOSIS — F17210 Nicotine dependence, cigarettes, uncomplicated: Secondary | ICD-10-CM | POA: Diagnosis not present

## 2014-12-15 DIAGNOSIS — Z3202 Encounter for pregnancy test, result negative: Secondary | ICD-10-CM | POA: Diagnosis not present

## 2014-12-15 DIAGNOSIS — N926 Irregular menstruation, unspecified: Secondary | ICD-10-CM

## 2014-12-15 LAB — URINE MICROSCOPIC-ADD ON

## 2014-12-15 LAB — URINALYSIS, ROUTINE W REFLEX MICROSCOPIC
Bilirubin Urine: NEGATIVE
GLUCOSE, UA: NEGATIVE mg/dL
HGB URINE DIPSTICK: NEGATIVE
KETONES UR: NEGATIVE mg/dL
NITRITE: NEGATIVE
PH: 5.5 (ref 5.0–8.0)
PROTEIN: NEGATIVE mg/dL
Specific Gravity, Urine: <1.005 — ABNORMAL LOW (ref 1.005–1.030)
UROBILINOGEN UA: 0.2 mg/dL (ref 0.0–1.0)

## 2014-12-15 LAB — POCT PREGNANCY, URINE: PREG TEST UR: NEGATIVE

## 2014-12-15 NOTE — Discharge Instructions (Signed)
Contraception Choices Contraception (birth control) is the use of any methods or devices to prevent pregnancy. Below are some methods to help avoid pregnancy. HORMONAL METHODS   Contraceptive implant. This is a thin, plastic tube containing progesterone hormone. It does not contain estrogen hormone. Your health care provider inserts the tube in the inner part of the upper arm. The tube can remain in place for up to 3 years. After 3 years, the implant must be removed. The implant prevents the ovaries from releasing an egg (ovulation), thickens the cervical mucus to prevent sperm from entering the uterus, and thins the lining of the inside of the uterus.  Progesterone-only injections. These injections are given every 3 months by your health care provider to prevent pregnancy. This synthetic progesterone hormone stops the ovaries from releasing eggs. It also thickens cervical mucus and changes the uterine lining. This makes it harder for sperm to survive in the uterus.  Birth control pills. These pills contain estrogen and progesterone hormone. They work by preventing the ovaries from releasing eggs (ovulation). They also cause the cervical mucus to thicken, preventing the sperm from entering the uterus. Birth control pills are prescribed by a health care provider.Birth control pills can also be used to treat heavy periods.  Minipill. This type of birth control pill contains only the progesterone hormone. They are taken every day of each month and must be prescribed by your health care provider.  Birth control patch. The patch contains hormones similar to those in birth control pills. It must be changed once a week and is prescribed by a health care provider.  Vaginal ring. The ring contains hormones similar to those in birth control pills. It is left in the vagina for 3 weeks, removed for 1 week, and then a new one is put back in place. The patient must be comfortable inserting and removing the ring  from the vagina.A health care provider's prescription is necessary.  Emergency contraception. Emergency contraceptives prevent pregnancy after unprotected sexual intercourse. This pill can be taken right after sex or up to 5 days after unprotected sex. It is most effective the sooner you take the pills after having sexual intercourse. Most emergency contraceptive pills are available without a prescription. Check with your pharmacist. Do not use emergency contraception as your only form of birth control. BARRIER METHODS   Female condom. This is a thin sheath (latex or rubber) that is worn over the penis during sexual intercourse. It can be used with spermicide to increase effectiveness.  Female condom. This is a soft, loose-fitting sheath that is put into the vagina before sexual intercourse.  Diaphragm. This is a soft, latex, dome-shaped barrier that must be fitted by a health care provider. It is inserted into the vagina, along with a spermicidal jelly. It is inserted before intercourse. The diaphragm should be left in the vagina for 6 to 8 hours after intercourse.  Cervical cap. This is a round, soft, latex or plastic cup that fits over the cervix and must be fitted by a health care provider. The cap can be left in place for up to 48 hours after intercourse.  Sponge. This is a soft, circular piece of polyurethane foam. The sponge has spermicide in it. It is inserted into the vagina after wetting it and before sexual intercourse.  Spermicides. These are chemicals that kill or block sperm from entering the cervix and uterus. They come in the form of creams, jellies, suppositories, foam, or tablets. They do not require a   prescription. They are inserted into the vagina with an applicator before having sexual intercourse. The process must be repeated every time you have sexual intercourse. INTRAUTERINE CONTRACEPTION  Intrauterine device (IUD). This is a T-shaped device that is put in a woman's uterus  during a menstrual period to prevent pregnancy. There are 2 types:  Copper IUD. This type of IUD is wrapped in copper wire and is placed inside the uterus. Copper makes the uterus and fallopian tubes produce a fluid that kills sperm. It can stay in place for 10 years.  Hormone IUD. This type of IUD contains the hormone progestin (synthetic progesterone). The hormone thickens the cervical mucus and prevents sperm from entering the uterus, and it also thins the uterine lining to prevent implantation of a fertilized egg. The hormone can weaken or kill the sperm that get into the uterus. It can stay in place for 3-5 years, depending on which type of IUD is used. PERMANENT METHODS OF CONTRACEPTION  Female tubal ligation. This is when the woman's fallopian tubes are surgically sealed, tied, or blocked to prevent the egg from traveling to the uterus.  Hysteroscopic sterilization. This involves placing a small coil or insert into each fallopian tube. Your doctor uses a technique called hysteroscopy to do the procedure. The device causes scar tissue to form. This results in permanent blockage of the fallopian tubes, so the sperm cannot fertilize the egg. It takes about 3 months after the procedure for the tubes to become blocked. You must use another form of birth control for these 3 months.  Female sterilization. This is when the female has the tubes that carry sperm tied off (vasectomy).This blocks sperm from entering the vagina during sexual intercourse. After the procedure, the man can still ejaculate fluid (semen). NATURAL PLANNING METHODS  Natural family planning. This is not having sexual intercourse or using a barrier method (condom, diaphragm, cervical cap) on days the woman could become pregnant.  Calendar method. This is keeping track of the length of each menstrual cycle and identifying when you are fertile.  Ovulation method. This is avoiding sexual intercourse during ovulation.  Symptothermal  method. This is avoiding sexual intercourse during ovulation, using a thermometer and ovulation symptoms.  Post-ovulation method. This is timing sexual intercourse after you have ovulated. Regardless of which type or method of contraception you choose, it is important that you use condoms to protect against the transmission of sexually transmitted infections (STIs). Talk with your health care provider about which form of contraception is most appropriate for you. Document Released: 07/08/2005 Document Revised: 07/13/2013 Document Reviewed: 12/31/2012 ExitCare Patient Information 2015 [employer]. This information is not intended to replace advice given to you by your health care provider. Make sure you discuss any questions you have with your health care provider.  

## 2014-12-15 NOTE — MAU Provider Note (Signed)
History     CSN: [id number]  Arrival date and time: 12/15/14 2231   None     No chief complaint on file.  HPI  Stephanie Richards is a 39 y.o. who presents today for a pregnancy test. She states that she took two at home, and they were invalid, no control line. She denies any vaginal bleeding or abdominal pain. She states that her LMP was around 4/20. She has no other concerns today.   No past medical history on file.  No past surgical history on file.  No family history on file.  History  Substance Use Topics  . Smoking status: Current Every Day Smoker -- 0.50 packs/day    Types: Cigarettes  . Smokeless tobacco: Not on file  . Alcohol Use: Yes    Allergies: No Known Allergies  Prescriptions prior to admission  Medication Sig Dispense Refill Last Dose  . albuterol (PROVENTIL HFA;VENTOLIN HFA) 108 (90 BASE) MCG/ACT inhaler Inhale 2 puffs into the lungs every 6 (six) hours as needed for wheezing or shortness of breath.   Past Week at Unknown time  . HYDROcodone-acetaminophen (HYCET) 7.5-325 mg/15 ml solution Take 15 mLs by mouth every 4 (four) hours as needed for moderate pain or severe pain. 120 mL 0     Review of Systems  Gastrointestinal: Negative for nausea, vomiting and abdominal pain.  Genitourinary: Negative for dysuria, urgency and frequency.   Physical Exam   Blood pressure 100/60, pulse 72, temperature 98.8 F (37.1 C), temperature source Oral, resp. rate 16, height 5\' 5"  (1.651 m), weight 72.576 kg (160 lb), last menstrual period 11/08/2014, SpO2 100 %.  Physical Exam  Nursing note and vitals reviewed. Constitutional: She is oriented to person, place, and time. She appears well-developed and well-nourished. No distress.  HENT:  Head: Normocephalic.  Cardiovascular: Normal rate.   Respiratory: Effort normal.  Neurological: She is alert and oriented to person, place, and time.  Psychiatric: She has a normal mood and affect.    Results for orders placed  or performed during the hospital encounter of 12/15/14 (from the past 24 hour(s))  Urinalysis, Routine w reflex microscopic (not at [facility])     Status: Abnormal   Collection Time: 12/15/14 10:45 PM  Result Value Ref Range   Color, Urine YELLOW YELLOW   APPearance CLEAR CLEAR   Specific Gravity, Urine <1.005 (L) 1.005 - 1.030   pH 5.5 5.0 - 8.0   Glucose, UA NEGATIVE NEGATIVE mg/dL   Hgb urine dipstick NEGATIVE NEGATIVE   Bilirubin Urine NEGATIVE NEGATIVE   Ketones, ur NEGATIVE NEGATIVE mg/dL   Protein, ur NEGATIVE NEGATIVE mg/dL   Urobilinogen, UA 0.2 0.0 - 1.0 mg/dL   Nitrite NEGATIVE NEGATIVE   Leukocytes, UA SMALL (A) NEGATIVE  Urine microscopic-add on     Status: None   Collection Time: 12/15/14 10:45 PM  Result Value Ref Range   Squamous Epithelial / LPF RARE RARE   WBC, UA 3-6 <3 WBC/hpf   Bacteria, UA RARE RARE   Urine-Other TRICHOMONAS PRESENT   Pregnancy, urine POC     Status: None   Collection Time: 12/15/14 10:54 PM  Result Value Ref Range   Preg Test, Ur NEGATIVE NEGATIVE    MAU Course  Procedures  MDM   Assessment and Plan   1. Encounter for pregnancy test with result negative   2. Irregular menstrual cycle    DC home Take HPT if no menses in >1 week Return to MAU as needed  Follow-up  Information    Follow up with Kathreen Cosier, MD.   Specialty:  Obstetrics and Gynecology   Why:  As needed   Contact information:   [address] [place] [address] [phone]       Tawnya Crook 12/15/2014, 11:20 PM

## 2014-12-15 NOTE — MAU Note (Signed)
Pt states she thinks she might be pregnant. States her LMP was 11/08/2014.

## 2014-12-19 ENCOUNTER — Encounter (HOSPITAL_COMMUNITY): Payer: Self-pay | Admitting: Emergency Medicine

## 2014-12-19 ENCOUNTER — Emergency Department (HOSPITAL_COMMUNITY)
Admission: EM | Admit: 2014-12-19 | Discharge: 2014-12-19 | Disposition: A | Discharge status: Home / Self Care | Payer: Medicaid Other | Attending: Emergency Medicine | Admitting: Emergency Medicine

## 2014-12-19 DIAGNOSIS — Z720 Tobacco use: Secondary | ICD-10-CM | POA: Insufficient documentation

## 2014-12-19 DIAGNOSIS — Z79899 Other long term (current) drug therapy: Secondary | ICD-10-CM | POA: Insufficient documentation

## 2014-12-19 DIAGNOSIS — A5901 Trichomonal vulvovaginitis: Secondary | ICD-10-CM | POA: Diagnosis not present

## 2014-12-19 DIAGNOSIS — B9689 Other specified bacterial agents as the cause of diseases classified elsewhere: Secondary | ICD-10-CM

## 2014-12-19 DIAGNOSIS — N760 Acute vaginitis: Secondary | ICD-10-CM | POA: Diagnosis not present

## 2014-12-19 DIAGNOSIS — R102 Pelvic and perineal pain: Secondary | ICD-10-CM | POA: Diagnosis present

## 2014-12-19 DIAGNOSIS — Z3202 Encounter for pregnancy test, result negative: Secondary | ICD-10-CM | POA: Diagnosis not present

## 2014-12-19 DIAGNOSIS — A599 Trichomoniasis, unspecified: Secondary | ICD-10-CM

## 2014-12-19 LAB — WET PREP, GENITAL
Trich, Wet Prep: NONE SEEN
WBC, Wet Prep HPF POC: NONE SEEN
YEAST WET PREP: NONE SEEN

## 2014-12-19 LAB — URINE MICROSCOPIC-ADD ON

## 2014-12-19 LAB — URINALYSIS, ROUTINE W REFLEX MICROSCOPIC
Bilirubin Urine: NEGATIVE
Glucose, UA: NEGATIVE mg/dL
Hgb urine dipstick: NEGATIVE
Ketones, ur: NEGATIVE mg/dL
NITRITE: NEGATIVE
PROTEIN: NEGATIVE mg/dL
Specific Gravity, Urine: 1.015 (ref 1.005–1.030)
Urobilinogen, UA: 1 mg/dL (ref 0.0–1.0)
pH: 6 (ref 5.0–8.0)

## 2014-12-19 LAB — POC URINE PREG, ED: Preg Test, Ur: NEGATIVE

## 2014-12-19 MED ORDER — METRONIDAZOLE 500 MG PO TABS: 500.0000 mg | ORAL_TABLET | Freq: Two times a day (BID) | ORAL | Status: DC

## 2014-12-19 NOTE — ED Provider Notes (Signed)
CSN: [id number]     Arrival date & time 12/19/14  0233 History   None    This chart was scribed for Loren Racer, MD by Arlan Organ, ED Scribe. This patient was seen in room D34C/D34C and the patient's care was started 3:49 AM.   Chief Complaint  Patient presents with  . Pelvic Pain   The history is provided by the patient. No language interpreter was used.    HPI Comments: Stephanie Richards is a 39 y.o. female without any pertinent past medical history who presents to the Emergency Department complaining of intermittent, ongoing, unchanged pelvic pain x 1 week. Pt also reports white discharge from the L breast today. No recent fever, chills, nausea, or vomiting. No vaginal discharge or dysuria. Pt is requesting a pregnancy test this evening. At home test came back negative. She also had a negative pregnancy test at [facility] on 5/26. LNMP 11/08/14. She admits to being sexually active. No known allergies to medications.   History reviewed. No pertinent past medical history. History reviewed. No pertinent past surgical history. No family history on file. History  Substance Use Topics  . Smoking status: Current Every Day Smoker -- 0.50 packs/day    Types: Cigarettes  . Smokeless tobacco: Not on file  . Alcohol Use: Yes   OB History    No data available     Review of Systems  Constitutional: Negative for fever and chills.  Respiratory: Negative for shortness of breath.   Gastrointestinal: Negative for nausea, vomiting, abdominal pain and diarrhea.  Genitourinary: Positive for pelvic pain. Negative for dysuria and vaginal discharge.  Skin: Negative for rash.  Psychiatric/Behavioral: Negative for confusion.  All other systems reviewed and are negative.     Allergies  Review of patient's allergies indicates no known allergies.  Home Medications   Prior to Admission medications   Medication Sig Start Date End Date Taking? Authorizing Provider  albuterol (PROVENTIL  HFA;VENTOLIN HFA) 108 (90 BASE) MCG/ACT inhaler Inhale 2 puffs into the lungs every 6 (six) hours as needed for wheezing or shortness of breath.   Yes Historical Provider, MD  metroNIDAZOLE (FLAGYL) 500 MG tablet Take 1 tablet (500 mg total) by mouth 2 (two) times daily. One po bid x 7 days 12/19/14   Loren Racer, MD   Triage Vitals: BP 112/81 mmHg  Pulse 63  Temp(Src) 97.9 F (36.6 C) (Oral)  Resp 14  SpO2 100%  LMP 11/08/2014   Physical Exam  Constitutional: She is oriented to person, place, and time. She appears well-developed and well-nourished. No distress.  HENT:  Head: Normocephalic and atraumatic.  Mouth/Throat: Oropharynx is clear and moist.  Eyes: EOM are normal. Pupils are equal, round, and reactive to light.  Neck: Normal range of motion. Neck supple.  Cardiovascular: Normal rate and regular rhythm.   Pulmonary/Chest: Effort normal and breath sounds normal. No respiratory distress. She has no wheezes. She has no rales.  Abdominal: Soft. Bowel sounds are normal. There is tenderness (mild lower abdominal tenderness without rebound or guarding.).  Genitourinary:  See genitourinary physical exam by Antony Madura, PA  Musculoskeletal: Normal range of motion. She exhibits no edema or tenderness.  No bilateral CVA tenderness.  Neurological: She is alert and oriented to person, place, and time.  Skin: Skin is warm and dry. No rash noted. No erythema.  Psychiatric: She has a normal mood and affect. Her behavior is normal.  Nursing note and vitals reviewed.   ED Course  Procedures (including  critical care time)  DIAGNOSTIC STUDIES: Oxygen Saturation is 100% on RA, Normal by my interpretation.    COORDINATION OF CARE: 3:50 AM- Will order urinalysis and urine pregnancy. Will perform pelvic examination. Discussed treatment plan with pt at bedside and pt agreed to plan.     Labs Review Labs Reviewed  WET PREP, GENITAL - Abnormal; Notable for the following:    Clue Cells Wet  Prep HPF POC MANY (*)    All other components within normal limits  URINALYSIS, ROUTINE W REFLEX MICROSCOPIC (NOT AT [facility]) - Abnormal; Notable for the following:    Leukocytes, UA SMALL (*)    All other components within normal limits  URINE MICROSCOPIC-ADD ON - Abnormal; Notable for the following:    Squamous Epithelial / LPF FEW (*)    All other components within normal limits  POC URINE PREG, ED  GC/CHLAMYDIA PROBE AMP ([place]) NOT AT [facility]    Imaging Review No results found.   EKG Interpretation None      MDM   Final diagnoses:  Bacterial vaginosis  Trichomonal infection   I personally performed the services described in this documentation, which was scribed in my presence. The recorded information has been reviewed and is accurate.  Patient's pain is improved. Low concern for emergent surgical process. Patient is advised to follow-up with women's clinic. Return precautions given.  Loren Raceravid Nathanel Tallman, MD 12/30/14 [phone]

## 2014-12-19 NOTE — Discharge Instructions (Signed)
Trichomoniasis °Trichomoniasis is an infection caused by an organism called Trichomonas. The infection can affect both women and men. In women, the outer female genitalia and the vagina are affected. In men, the penis is mainly affected, but the prostate and other reproductive organs can also be involved. Trichomoniasis is a sexually transmitted infection (STI) and is most often passed to another person through sexual contact.  °RISK FACTORS °· Having unprotected sexual intercourse. °· Having sexual intercourse with an infected partner. °SIGNS AND SYMPTOMS  °Symptoms of trichomoniasis in women include: °· Abnormal gray-green frothy vaginal discharge. °· Itching and irritation of the vagina. °· Itching and irritation of the area outside the vagina. °Symptoms of trichomoniasis in men include:  °· Penile discharge with or without pain. °· Pain during urination. This results from inflammation of the urethra. °DIAGNOSIS  °Trichomoniasis may be found during a Pap test or physical exam. Your health care provider may use one of the following methods to help diagnose this infection: °· Examining vaginal discharge under a microscope. For men, urethral discharge would be examined. °· Testing the pH of the vagina with a test tape. °· Using a vaginal swab test that checks for the Trichomonas organism. A test is available that provides results within a few minutes. °· Doing a culture test for the organism. This is not usually needed. °TREATMENT  °· You may be given medicine to fight the infection. Women should inform their health care provider if they could be or are pregnant. Some medicines used to treat the infection should not be taken during pregnancy. °· Your health care provider may recommend over-the-counter medicines or creams to decrease itching or irritation. °· Your sexual partner will need to be treated if infected. °HOME CARE INSTRUCTIONS  °· Take medicines only as directed by your health care provider. °· Take  over-the-counter medicine for itching or irritation as directed by your health care provider. °· Do not have sexual intercourse while you have the infection. °· Women should not douche or wear tampons while they have the infection. °· Discuss your infection with your partner. Your partner may have gotten the infection from you, or you may have gotten it from your partner. °· Have your sex partner get examined and treated if necessary. °· Practice safe, informed, and protected sex. °· See your health care provider for other STI testing. °SEEK MEDICAL CARE IF:  °· You still have symptoms after you finish your medicine. °· You develop abdominal pain. °· You have pain when you urinate. °· You have bleeding after sexual intercourse. °· You develop a rash. °· Your medicine makes you sick or makes you throw up (vomit). °MAKE SURE YOU: °· Understand these instructions. °· Will watch your condition. °· Will get help right away if you are not doing well or get worse. °Document Released: 01/01/2001 Document Revised: 11/22/2013 Document Reviewed: 04/19/2013 °ExitCare® Patient Information ©2015 [employer]. This information is not intended to replace advice given to you by your health care provider. Make sure you discuss any questions you have with your health care provider. °Bacterial Vaginosis °Bacterial vaginosis is an infection of the vagina. It happens when too many of certain germs (bacteria) grow in the vagina. °HOME CARE °· Take your medicine as told by your doctor. °· Finish your medicine even if you start to feel better. °· Do not have sex until you finish your medicine and are better. °· Tell your sex partner that you have an infection. They should see their doctor for   treatment. °· Practice safe sex. Use condoms. Have only one sex partner. °GET HELP IF: °· You are not getting better after 3 days of treatment. °· You have more grey fluid (discharge) coming from your vagina than before. °· You have more pain than  before. °· You have a fever. °MAKE SURE YOU:  °· Understand these instructions. °· Will watch your condition. °· Will get help right away if you are not doing well or get worse. °Document Released: 04/16/2008 Document Revised: 04/28/2013 Document Reviewed: 02/17/2013 °ExitCare® Patient Information ©2015 [employer]. This information is not intended to replace advice given to you by your health care provider. Make sure you discuss any questions you have with your health care provider. ° °

## 2014-12-19 NOTE — ED Provider Notes (Signed)
Physical Exam  Genitourinary: Vulva normal. Uterus is tender (mild). Cervix exhibits motion tenderness (mild). Right adnexum displays no deviation, no mass and no tenderness. Left adnexum displays no deviation, no mass and no tenderness. Thick  white and vaginal discharge found.  Exam chaperoned by nursing tech   Wet prep and GC/Chlamydia cultures taken during exam. Patient tolerated procedure well with no immediate complications.  Antony MaduraKelly Sami Froh, PA-C 12/19/14 [phone]  Loren Raceravid Yelverton, MD 12/19/14 [phone]

## 2014-12-19 NOTE — ED Notes (Addendum)
C/o intermittent pelvic pain x 1 week.  Reports white discharge from L breast today.  LMP 11/08/14.  Pt requesting a pregnancy test.  Home test was negative.  [facility]

## 2014-12-20 LAB — GC/CHLAMYDIA PROBE AMP (~~LOC~~) NOT AT ARMC
Chlamydia: NEGATIVE
Neisseria Gonorrhea: NEGATIVE

## 2015-01-14 ENCOUNTER — Emergency Department (HOSPITAL_COMMUNITY)
Admission: EM | Admit: 2015-01-14 | Discharge: 2015-01-14 | Disposition: A | Discharge status: Home / Self Care | Payer: Medicaid Other | Attending: Emergency Medicine | Admitting: Emergency Medicine

## 2015-01-14 ENCOUNTER — Encounter (HOSPITAL_COMMUNITY): Payer: Self-pay | Admitting: Emergency Medicine

## 2015-01-14 DIAGNOSIS — Z79899 Other long term (current) drug therapy: Secondary | ICD-10-CM | POA: Insufficient documentation

## 2015-01-14 DIAGNOSIS — Z3202 Encounter for pregnancy test, result negative: Secondary | ICD-10-CM | POA: Insufficient documentation

## 2015-01-14 DIAGNOSIS — M545 Low back pain, unspecified: Secondary | ICD-10-CM

## 2015-01-14 DIAGNOSIS — Z720 Tobacco use: Secondary | ICD-10-CM | POA: Insufficient documentation

## 2015-01-14 HISTORY — DX: Unspecified osteoarthritis, unspecified site: M19.90

## 2015-01-14 LAB — URINALYSIS, ROUTINE W REFLEX MICROSCOPIC
BILIRUBIN URINE: NEGATIVE
GLUCOSE, UA: NEGATIVE mg/dL
HGB URINE DIPSTICK: NEGATIVE
Ketones, ur: NEGATIVE mg/dL
NITRITE: NEGATIVE
PROTEIN: NEGATIVE mg/dL
SPECIFIC GRAVITY, URINE: 1.025 (ref 1.005–1.030)
UROBILINOGEN UA: 1 mg/dL (ref 0.0–1.0)
pH: 6 (ref 5.0–8.0)

## 2015-01-14 LAB — POC URINE PREG, ED: Preg Test, Ur: NEGATIVE

## 2015-01-14 LAB — URINE MICROSCOPIC-ADD ON

## 2015-01-14 MED ORDER — NAPROXEN 500 MG PO TABS
500.0000 mg | ORAL_TABLET | Freq: Once | ORAL | Status: AC
  Administered 2015-01-14: 500 mg via ORAL
  Filled 2015-01-14: qty 1

## 2015-01-14 MED ORDER — HYDROCODONE-ACETAMINOPHEN 5-325 MG PO TABS
2.0000 | ORAL_TABLET | Freq: Once | ORAL | Status: AC
  Administered 2015-01-14: 2 via ORAL
  Filled 2015-01-14: qty 2

## 2015-01-14 MED ORDER — METHOCARBAMOL 750 MG PO TABS: 750.0000 mg | ORAL_TABLET | Freq: Three times a day (TID) | ORAL | Status: DC | PRN

## 2015-01-14 MED ORDER — METHOCARBAMOL 500 MG PO TABS
1000.0000 mg | ORAL_TABLET | Freq: Once | ORAL | Status: AC
  Administered 2015-01-14: 1000 mg via ORAL
  Filled 2015-01-14: qty 2

## 2015-01-14 MED ORDER — NAPROXEN 500 MG PO TABS: 500.0000 mg | ORAL_TABLET | Freq: Two times a day (BID) | ORAL | Status: DC

## 2015-01-14 NOTE — Discharge Instructions (Signed)

## 2015-01-14 NOTE — ED Provider Notes (Signed)
CSN: [id number]     Arrival date & time 01/14/15  0130 History   First MD Initiated Contact with Patient 01/14/15 0247     Chief Complaint  Patient presents with  . Back Pain     (Consider location/radiation/quality/duration/timing/severity/associated sxs/prior Treatment) HPI 39 year old female presents to the emergency department with worsening right low back pain over the last week.  Patient has history of similar symptoms in the past.  She describes the pain as crampy in nature.  She has taken Aleve without improvement.  She denies any bowel or bladder incontinence, no urinary frequency or pain.  She denies any new trauma to the area.  No fevers or chills.  Patient reports that she has a primary care doctor, but when the pain gets this bad she just comes to the ER. Past Medical History  Diagnosis Date  . Arthritis    History reviewed. No pertinent past surgical history. History reviewed. No pertinent family history. History  Substance Use Topics  . Smoking status: Current Every Day Smoker -- 0.50 packs/day    Types: Cigarettes  . Smokeless tobacco: Not on file  . Alcohol Use: Yes   OB History    No data available     Review of Systems   See History of Present Illness; otherwise all other systems are reviewed and negative  Allergies  Review of patient's allergies indicates no known allergies.  Home Medications   Prior to Admission medications   Medication Sig Start Date End Date Taking? Authorizing Provider  albuterol (PROVENTIL HFA;VENTOLIN HFA) 108 (90 BASE) MCG/ACT inhaler Inhale 2 puffs into the lungs every 6 (six) hours as needed for wheezing or shortness of breath.    Historical Provider, MD  methocarbamol (ROBAXIN-750) 750 MG tablet Take 1 tablet (750 mg total) by mouth every 8 (eight) hours as needed for muscle spasms. 01/14/15   Marisa Severin, MD  metroNIDAZOLE (FLAGYL) 500 MG tablet Take 1 tablet (500 mg total) by mouth 2 (two) times daily. One po bid x 7  days Patient not taking: Reported on 01/14/2015 12/19/14   Loren Racer, MD  naproxen (NAPROSYN) 500 MG tablet Take 1 tablet (500 mg total) by mouth 2 (two) times daily. 01/14/15   Marisa Severin, MD   BP 107/70 mmHg  Pulse 71  Temp(Src) 98.4 F (36.9 C) (Oral)  Resp 18  Ht 5\' 5"  (1.651 m)  Wt 160 lb (72.576 kg)  BMI 26.63 kg/m2  SpO2 98%  LMP 12/20/2014 Physical Exam  Constitutional: She is oriented to person, place, and time. She appears well-developed and well-nourished.  HENT:  Head: Normocephalic and atraumatic.  Nose: Nose normal.  Mouth/Throat: Oropharynx is clear and moist.  Eyes: Conjunctivae and EOM are normal. Pupils are equal, round, and reactive to light.  Neck: Normal range of motion. Neck supple. No JVD present. No tracheal deviation present. No thyromegaly present.  Cardiovascular: Normal rate, regular rhythm, normal heart sounds and intact distal pulses.  Exam reveals no gallop and no friction rub.   No murmur heard. Pulmonary/Chest: Effort normal and breath sounds normal. No stridor. No respiratory distress. She has no wheezes. She has no rales. She exhibits no tenderness.  Abdominal: Soft. Bowel sounds are normal. She exhibits no distension and no mass. There is no tenderness. There is no rebound and no guarding.  Musculoskeletal: Normal range of motion. She exhibits tenderness (patient has tenderness to right paraspinal musculature along lower lumbar.  There is no step-off or crepitus.  No overlying skin changes.).  She exhibits no edema.  Lymphadenopathy:    She has no cervical adenopathy.  Neurological: She is alert and oriented to person, place, and time. She displays normal reflexes. She exhibits normal muscle tone. Coordination normal.  Skin: Skin is warm and dry. No rash noted. No erythema. No pallor.  Psychiatric: She has a normal mood and affect. Her behavior is normal. Judgment and thought content normal.  Nursing note and vitals reviewed.   ED Course   Procedures (including critical care time) Labs Review Labs Reviewed  URINALYSIS, ROUTINE W REFLEX MICROSCOPIC (NOT AT [facility]) - Abnormal; Notable for the following:    Color, Urine AMBER (*)    APPearance CLOUDY (*)    Leukocytes, UA MODERATE (*)    All other components within normal limits  URINE MICROSCOPIC-ADD ON  POC URINE PREG, ED    Imaging Review No results found.   EKG Interpretation None      MDM   Final diagnoses:  Right-sided low back pain without sciatica    39 year old female with right low back pain, acute on chronic.  Plan for acute pain control here in the emergency department.  No signs of urinary tract infection, patient is not pregnant.  She has no allergies.  Patient to be given back pain exercise handout and follow-up with primary care doctor.    Marisa Severin, MD 01/14/15 [phone]

## 2015-01-14 NOTE — ED Notes (Addendum)
Pt from home states she has a history of back pain. She states the pain is mostly in her lower right back. She describes it as a "cramping".

## 2018-04-23 ENCOUNTER — Emergency Department (HOSPITAL_COMMUNITY)
Admission: EM | Admit: 2018-04-23 | Discharge: 2018-04-24 | Disposition: A | Discharge status: Home / Self Care | Payer: Medicaid Other | Attending: Emergency Medicine | Admitting: Emergency Medicine

## 2018-04-23 ENCOUNTER — Other Ambulatory Visit: Payer: Self-pay

## 2018-04-23 ENCOUNTER — Encounter (HOSPITAL_COMMUNITY): Payer: Self-pay | Admitting: Emergency Medicine

## 2018-04-23 ENCOUNTER — Emergency Department (HOSPITAL_COMMUNITY): Payer: Medicaid Other

## 2018-04-23 DIAGNOSIS — F17210 Nicotine dependence, cigarettes, uncomplicated: Secondary | ICD-10-CM | POA: Diagnosis not present

## 2018-04-23 DIAGNOSIS — R062 Wheezing: Secondary | ICD-10-CM | POA: Insufficient documentation

## 2018-04-23 MED ORDER — IPRATROPIUM-ALBUTEROL 0.5-2.5 (3) MG/3ML IN SOLN
3.0000 mL | Freq: Once | RESPIRATORY_TRACT | Status: AC
  Administered 2018-04-23: 3 mL via RESPIRATORY_TRACT
  Filled 2018-04-23: qty 3

## 2018-04-23 MED ORDER — ACETAMINOPHEN 500 MG PO TABS: 1000.0000 mg | ORAL_TABLET | Freq: Once | ORAL | Status: DC

## 2018-04-23 MED ORDER — IBUPROFEN 200 MG PO TABS
600.0000 mg | ORAL_TABLET | Freq: Once | ORAL | Status: AC
  Administered 2018-04-23: 600 mg via ORAL
  Filled 2018-04-23: qty 1

## 2018-04-23 MED ORDER — ALBUTEROL SULFATE (2.5 MG/3ML) 0.083% IN NEBU
5.0000 mg | INHALATION_SOLUTION | Freq: Once | RESPIRATORY_TRACT | Status: AC
  Administered 2018-04-23: 5 mg via RESPIRATORY_TRACT
  Filled 2018-04-23: qty 6

## 2018-04-23 MED ORDER — ACETAMINOPHEN 325 MG PO TABS
650.0000 mg | ORAL_TABLET | Freq: Once | ORAL | Status: AC | PRN
  Administered 2018-04-23: 650 mg via ORAL
  Filled 2018-04-23: qty 2

## 2018-04-23 MED ORDER — PREDNISONE 20 MG PO TABS
60.0000 mg | ORAL_TABLET | Freq: Once | ORAL | Status: AC
  Administered 2018-04-23: 60 mg via ORAL
  Filled 2018-04-23: qty 3

## 2018-04-23 NOTE — ED Provider Notes (Signed)
[facility] EMERGENCY DEPARTMENT Provider Note   CSN: [id number] Arrival date & time: 04/23/18  2106     History   Chief Complaint Chief Complaint  Patient presents with  . Generalized Body Aches  . Cough    HPI Stephanie SAMARIN is a 42 y.o. female.  Patient presents to the emergency department with a chief complaint of generalized body aches and cough.  She has a reported history of asthma, but does not take anything for this.  She does not have an inhaler at home.  She reports nonproductive cough, but has felt short of breath.  States that she did have a sore throat earlier, but does not have one now.  States that this feels similar to when she was diagnosed with bronchitis in the past.  She denies any nausea, vomiting, diarrhea.  Denies any other associated symptoms.  Patient tried OTC Tylenol.  The history is provided by the patient. No language interpreter was used.    Past Medical History:  Diagnosis Date  . Arthritis     There are no active problems to display for this patient.   History reviewed. No pertinent surgical history.   OB History   None      Home Medications    Prior to Admission medications   Medication Sig Start Date End Date Taking? Authorizing Provider  methocarbamol (ROBAXIN-750) 750 MG tablet Take 1 tablet (750 mg total) by mouth every 8 (eight) hours as needed for muscle spasms. Patient not taking: Reported on 04/23/2018 01/14/15   Marisa Severin, MD  metroNIDAZOLE (FLAGYL) 500 MG tablet Take 1 tablet (500 mg total) by mouth 2 (two) times daily. One po bid x 7 days Patient not taking: Reported on 01/14/2015 12/19/14   Loren Racer, MD  naproxen (NAPROSYN) 500 MG tablet Take 1 tablet (500 mg total) by mouth 2 (two) times daily. Patient not taking: Reported on 04/23/2018 01/14/15   Marisa Severin, MD    Family History No family history on file.  Social History Social History   Tobacco Use  . Smoking status: Current Every  Day Smoker    Packs/day: 0.50    Types: Cigarettes  . Smokeless tobacco: Never Used  Substance Use Topics  . Alcohol use: Yes  . Drug use: No     Allergies   Patient has no known allergies.   Review of Systems Review of Systems  All other systems reviewed and are negative.    Physical Exam Updated Vital Signs BP 118/71   Pulse (!) 105   Temp 100.3 F (37.9 C) (Oral)   Resp 20   Ht 5\' 4"  (1.626 m)   Wt 72.6 kg   LMP 03/24/2018   SpO2 96%   BMI 27.46 kg/m   Physical Exam  Constitutional: She is oriented to person, place, and time. She appears well-developed and well-nourished.  HENT:  Head: Normocephalic and atraumatic.  Eyes: Pupils are equal, round, and reactive to light. Conjunctivae and EOM are normal.  Neck: Normal range of motion. Neck supple.  Cardiovascular: Normal rate and regular rhythm. Exam reveals no gallop and no friction rub.  No murmur heard. Pulmonary/Chest: Effort normal. No respiratory distress. She has wheezes. She has no rales. She exhibits no tenderness.  Wheezing anteriorly  Abdominal: Soft. Bowel sounds are normal. She exhibits no distension and no mass. There is no tenderness. There is no rebound and no guarding.  Musculoskeletal: Normal range of motion. She exhibits no edema or tenderness.  Neurological: She is alert and oriented to person, place, and time.  Skin: Skin is warm and dry.  Psychiatric: She has a normal mood and affect. Her behavior is normal. Judgment and thought content normal.  Nursing note and vitals reviewed.    ED Treatments / Results  Labs (all labs ordered are listed, but only abnormal results are displayed) Labs Reviewed - No data to display  EKG None  Radiology No results found.  Procedures Procedures (including critical care time)  Medications Ordered in ED Medications  ipratropium-albuterol (DUONEB) 0.5-2.5 (3) MG/3ML nebulizer solution 3 mL (has no administration in time range)  predniSONE  (DELTASONE) tablet 60 mg (has no administration in time range)  ibuprofen (ADVIL,MOTRIN) tablet 600 mg (has no administration in time range)  acetaminophen (TYLENOL) tablet 650 mg (650 mg Oral Given 04/23/18 2123)  albuterol (PROVENTIL) (2.5 MG/3ML) 0.083% nebulizer solution 5 mg (5 mg Nebulization Given 04/23/18 2123)     Initial Impression / Assessment and Plan / ED Course  I have reviewed the triage vital signs and the nursing notes.  Pertinent labs & imaging results that were available during my care of the patient were reviewed by me and considered in my medical decision making (see chart for details).    Patients symptoms are consistent with URI, likely viral etiology, but given her smoking hx and wheezing, will cover with azithromycin. Will give neb and prednisone for home as well. Verbalizes understanding and is agreeable with plan. Pt is hemodynamically stable & in NAD prior to dc.   Final Clinical Impressions(s) / ED Diagnoses   Final diagnoses:  Wheezing    ED Discharge Orders         Ordered    predniSONE (DELTASONE) 20 MG tablet  Daily     04/24/18 0021    azithromycin (ZITHROMAX) 250 MG tablet  Daily,   Status:  Discontinued     04/24/18 0021    azithromycin (ZITHROMAX) 250 MG tablet  Daily     04/24/18 0021           Roxy Horseman, PA-C 04/24/18 0100    Gilda Crease, MD 04/24/18 [phone]

## 2018-04-23 NOTE — ED Triage Notes (Signed)
Pt reports generalized body aches and small productive, clear mucous, cough that started yesterday. Pt reports chest pain when coughing. Pt says that she has had bronchitis in the past and this seems the same. Lung sounds wheezes, pt has hx of asthma but does not have inhaler. Denies N/V/D.

## 2018-04-24 MED ORDER — AZITHROMYCIN 250 MG PO TABS: 250.0000 mg | ORAL_TABLET | Freq: Every day | ORAL | 0 refills | Status: DC

## 2018-04-24 MED ORDER — PREDNISONE 20 MG PO TABS: 40.0000 mg | ORAL_TABLET | Freq: Every day | ORAL | 0 refills | Status: DC

## 2018-04-24 MED ORDER — ALBUTEROL SULFATE HFA 108 (90 BASE) MCG/ACT IN AERS
2.0000 | INHALATION_SPRAY | RESPIRATORY_TRACT | Status: DC | PRN
  Administered 2018-04-24: 2 via RESPIRATORY_TRACT
  Filled 2018-04-24: qty 6.7

## 2018-04-24 NOTE — ED Notes (Signed)
Pt stable, ambulatory, states understanding of discharge instructions 

## 2018-10-05 DIAGNOSIS — Z79899 Other long term (current) drug therapy: Secondary | ICD-10-CM | POA: Diagnosis not present

## 2018-10-05 DIAGNOSIS — M5441 Lumbago with sciatica, right side: Secondary | ICD-10-CM | POA: Diagnosis not present

## 2018-10-05 DIAGNOSIS — Z791 Long term (current) use of non-steroidal anti-inflammatories (NSAID): Secondary | ICD-10-CM | POA: Diagnosis not present

## 2019-02-23 ENCOUNTER — Encounter (HOSPITAL_COMMUNITY): Payer: Self-pay

## 2019-02-23 ENCOUNTER — Other Ambulatory Visit: Payer: Self-pay

## 2019-02-23 ENCOUNTER — Ambulatory Visit (HOSPITAL_COMMUNITY)
Admission: EM | Admit: 2019-02-23 | Discharge: 2019-02-23 | Disposition: A | Discharge status: Home / Self Care | Payer: Medicaid Other | Attending: Family Medicine | Admitting: Family Medicine

## 2019-02-23 DIAGNOSIS — M5431 Sciatica, right side: Secondary | ICD-10-CM

## 2019-02-23 HISTORY — DX: Unspecified asthma, uncomplicated: J45.909

## 2019-02-23 MED ORDER — DEXAMETHASONE SODIUM PHOSPHATE 10 MG/ML IJ SOLN
INTRAMUSCULAR | Status: AC
  Filled 2019-02-23: qty 1

## 2019-02-23 MED ORDER — METHOCARBAMOL 500 MG PO TABS: 500.0000 mg | ORAL_TABLET | Freq: Two times a day (BID) | ORAL | 0 refills | Status: DC

## 2019-02-23 MED ORDER — NAPROXEN 500 MG PO TABS: 500.0000 mg | ORAL_TABLET | Freq: Two times a day (BID) | ORAL | 0 refills | Status: DC

## 2019-02-23 MED ORDER — KETOROLAC TROMETHAMINE 30 MG/ML IJ SOLN
30.0000 mg | Freq: Once | INTRAMUSCULAR | Status: AC
  Administered 2019-02-23: 30 mg via INTRAVENOUS

## 2019-02-23 MED ORDER — KETOROLAC TROMETHAMINE 30 MG/ML IJ SOLN
INTRAMUSCULAR | Status: AC
  Filled 2019-02-23: qty 1

## 2019-02-23 MED ORDER — DEXAMETHASONE SODIUM PHOSPHATE 10 MG/ML IJ SOLN
10.0000 mg | Freq: Once | INTRAMUSCULAR | Status: AC
  Administered 2019-02-23: 12:00:00 10 mg via INTRAMUSCULAR

## 2019-02-23 NOTE — ED Triage Notes (Signed)
Pt presents with severe lower back pain for past few days; pt states she has a history of muscle spasms and was just recently diagnosed with having a lumbar issue.

## 2019-02-23 NOTE — ED Provider Notes (Signed)
[place]    CSN: [id number] Arrival date & time: 02/23/19  1114     History   Chief Complaint Chief Complaint  Patient presents with  . Back Pain    HPI Stephanie Richards is a 43 y.o. female.   Patient is a 43 year old female with past medical history of arthritis, asthma.  She presents today for chronic back pain.  This is a problem that is intermittent for her.  This exacerbation has been for approximate 2 days. The problem is constant.   Reporting some history of lower lumbar disc disease.  She has had 2 days of right lower back discomfort radiating into her right buttocks.  Reporting some mild numbness and tingling at times.  Pain is worse with laying down and trying to get up.  She has been taking Goody powders without much relief.  Was recently treated at another urgent care back in March for same symptoms.  Reporting the treatment help.  Symptoms feel the same.  Denies any associated saddle paresthesias, loss of bowel bladder function.  Denies any fever, dysuria, hematuria or urinary frequency.  No new injuries or heavy lifting.  Patient's last menstrual period was 02/17/2019.  ROS per HPI      Past Medical History:  Diagnosis Date  . Arthritis   . Asthma     There are no active problems to display for this patient.   History reviewed. No pertinent surgical history.  OB History   No obstetric history on file.      Home Medications    Prior to Admission medications   Medication Sig Start Date End Date Taking? Authorizing Provider  methocarbamol (ROBAXIN) 500 MG tablet Take 1 tablet (500 mg total) by mouth 2 (two) times daily. 02/23/19   Corvin Sorbo, Tressia Miners A, NP  naproxen (NAPROSYN) 500 MG tablet Take 1 tablet (500 mg total) by mouth 2 (two) times daily. 02/23/19   Orvan July, NP    Family History Family History  Family history unknown: Yes    Social History Social History   Tobacco Use  . Smoking status: Current Every Day Smoker    Packs/day:  0.50    Types: Cigarettes  . Smokeless tobacco: Never Used  Substance Use Topics  . Alcohol use: Yes  . Drug use: No     Allergies   Patient has no known allergies.   Review of Systems Review of Systems   Physical Exam Triage Vital Signs ED Triage Vitals [02/23/19 1135]  Enc Vitals Group     BP 103/71     Pulse Rate 84     Resp 18     Temp 98.2 F (36.8 C)     Temp Source Oral     SpO2 98 %     Weight      Height      Head Circumference      Peak Flow      Pain Score 10     Pain Loc      Pain Edu?      Excl. in [place]?    No data found.  Updated Vital Signs BP 103/71 (BP Location: Left Arm)   Pulse 84   Temp 98.2 F (36.8 C) (Oral)   Resp 18   LMP 02/17/2019   SpO2 98%   Visual Acuity Right Eye Distance:   Left Eye Distance:   Bilateral Distance:    Right Eye Near:   Left Eye Near:    Bilateral  Near:     Physical Exam Vitals signs and nursing note reviewed.  Constitutional:      General: She is not in acute distress.    Appearance: Normal appearance. She is not ill-appearing, toxic-appearing or diaphoretic.  HENT:     Head: Normocephalic and atraumatic.     Nose: Nose normal.  Eyes:     Conjunctiva/sclera: Conjunctivae normal.  Neck:     Musculoskeletal: Normal range of motion.  Pulmonary:     Effort: Pulmonary effort is normal.  Musculoskeletal: Normal range of motion.        General: No swelling.     Comments: TTP of the right lower lumbar paravertebral musculature.  No bruising, swelling or deformities.  Positive right straight leg raise.  Skin:    General: Skin is warm and dry.     Findings: No rash.  Neurological:     General: No focal deficit present.     Mental Status: She is alert.  Psychiatric:        Mood and Affect: Mood normal.      UC Treatments / Results  Labs (all labs ordered are listed, but only abnormal results are displayed) Labs Reviewed - No data to display  EKG   Radiology No results found.  Procedures  Procedures (including critical care time)  Medications Ordered in UC Medications  dexamethasone (DECADRON) injection 10 mg (10 mg Intramuscular Given 02/23/19 1210)  ketorolac (TORADOL) 30 MG/ML injection 30 mg (30 mg Intravenous Given 02/23/19 1210)  ketorolac (TORADOL) 30 MG/ML injection (has no administration in time range)  dexamethasone (DECADRON) 10 MG/ML injection (has no administration in time range)    Initial Impression / Assessment and Plan / UC Course  I have reviewed the triage vital signs and the nursing notes.  Pertinent labs & imaging results that were available during my care of the patient were reviewed by me and considered in my medical decision making (see chart for details).     Sciatic nerve pain-  This is a problem that comes and goes for her.  Most recent exacerbation was back in March. Treating today with steroid injection and Toradol in clinic.  Sending muscle relaxant to the pharmacy. Recommended gentle stretching, heat or massage. Follow up as needed for continued or worsening symptoms Final Clinical Impressions(s) / UC Diagnoses   Final diagnoses:  Sciatica of right side     Discharge Instructions     Treating you for sciatic nerve pain.  Steroid and pain injection given here in clinic today. Sending muscle relaxant to the pharmacy Recommended gentle stretching, heat and massage. Follow up as needed for continued or worsening symptoms     ED Prescriptions    Medication Sig Dispense Auth. Provider   methocarbamol (ROBAXIN) 500 MG tablet Take 1 tablet (500 mg total) by mouth 2 (two) times daily. 20 tablet Anika Shore A, NP   naproxen (NAPROSYN) 500 MG tablet Take 1 tablet (500 mg total) by mouth 2 (two) times daily. 30 tablet Dahlia ByesBast, Kalasia Crafton A, NP     Controlled Substance Prescriptions [place] Controlled Substance Registry consulted? Not Applicable   Janace ArisBast, Elward Nocera A, NP 02/23/19 1238

## 2019-02-23 NOTE — Discharge Instructions (Addendum)
Treating you for sciatic nerve pain.  Steroid and pain injection given here in clinic today. Sending muscle relaxant to the pharmacy Recommended gentle stretching, heat and massage. Follow up as needed for continued or worsening symptoms

## 2019-09-08 ENCOUNTER — Ambulatory Visit (HOSPITAL_COMMUNITY)
Admission: EM | Admit: 2019-09-08 | Discharge: 2019-09-08 | Disposition: A | Discharge status: Home / Self Care | Payer: Medicaid Other | Attending: Family Medicine | Admitting: Family Medicine

## 2019-09-08 ENCOUNTER — Encounter (HOSPITAL_COMMUNITY): Payer: Self-pay

## 2019-09-08 ENCOUNTER — Other Ambulatory Visit: Payer: Self-pay

## 2019-09-08 DIAGNOSIS — M545 Low back pain, unspecified: Secondary | ICD-10-CM

## 2019-09-08 DIAGNOSIS — J4531 Mild persistent asthma with (acute) exacerbation: Secondary | ICD-10-CM

## 2019-09-08 MED ORDER — KETOROLAC TROMETHAMINE 60 MG/2ML IM SOLN
60.0000 mg | Freq: Once | INTRAMUSCULAR | Status: AC
  Administered 2019-09-08: 60 mg via INTRAMUSCULAR

## 2019-09-08 MED ORDER — NAPROXEN 500 MG PO TABS: 500.0000 mg | ORAL_TABLET | Freq: Two times a day (BID) | ORAL | 0 refills | Status: DC

## 2019-09-08 MED ORDER — KETOROLAC TROMETHAMINE 60 MG/2ML IM SOLN
INTRAMUSCULAR | Status: AC
  Filled 2019-09-08: qty 2

## 2019-09-08 MED ORDER — ALBUTEROL SULFATE HFA 108 (90 BASE) MCG/ACT IN AERS: 1.0000 | INHALATION_SPRAY | Freq: Four times a day (QID) | RESPIRATORY_TRACT | 2 refills | Status: DC | PRN

## 2019-09-08 MED ORDER — METHOCARBAMOL 500 MG PO TABS: 500.0000 mg | ORAL_TABLET | Freq: Two times a day (BID) | ORAL | 0 refills | Status: DC

## 2019-09-08 MED ORDER — DEXAMETHASONE SODIUM PHOSPHATE 10 MG/ML IJ SOLN
10.0000 mg | Freq: Once | INTRAMUSCULAR | Status: AC
  Administered 2019-09-08: 12:00:00 10 mg via INTRAMUSCULAR

## 2019-09-08 MED ORDER — DEXAMETHASONE SODIUM PHOSPHATE 10 MG/ML IJ SOLN
INTRAMUSCULAR | Status: AC
  Filled 2019-09-08: qty 1

## 2019-09-08 NOTE — ED Triage Notes (Addendum)
Pt presents to UC with lower back pain x 1 day. Pt denies any numbness or tingling. Pt sates today she can not move "is too painful to walk". Pt requested albuterol inhaler.

## 2019-09-08 NOTE — Discharge Instructions (Addendum)

## 2019-09-08 NOTE — ED Provider Notes (Signed)
[facility]   [id number] 09/08/19 Arrival Time: 8341  ASSESSMENT & PLAN:  1. Acute right-sided low back pain without sciatica   2. Mild persistent asthma with acute exacerbation     Able to ambulate here and hemodynamically stable. No indication for imaging of back at this time given no trauma and normal neurological exam. H/O similar. Albuterol inhaler refilled. Work note provided. See AVS for d/c instructions.  Meds ordered this encounter  Medications  . ketorolac (TORADOL) injection 60 mg  . dexamethasone (DECADRON) injection 10 mg  . methocarbamol (ROBAXIN) 500 MG tablet    Sig: Take 1 tablet (500 mg total) by mouth 2 (two) times daily.    Dispense:  20 tablet    Refill:  0  . naproxen (NAPROSYN) 500 MG tablet    Sig: Take 1 tablet (500 mg total) by mouth 2 (two) times daily.    Dispense:  30 tablet    Refill:  0  . albuterol (VENTOLIN HFA) 108 (90 Base) MCG/ACT inhaler    Sig: Inhale 1-2 puffs into the lungs every 6 (six) hours as needed for wheezing or shortness of breath.    Dispense:  18 g    Refill:  2    Medication sedation precautions given. Encourage ROM/movement as tolerated.  Recommend: Follow-up Information    Schedule an appointment as soon as possible for a visit  with [facility].   Contact information: [place] [phone]       [facility].   Specialty: Urgent Care Why: As needed. Contact information: [place] [phone]          Reviewed expectations re: course of current medical issues. Questions answered. Outlined signs and symptoms indicating need for more acute intervention. Patient verbalized understanding. After Visit Summary given.   SUBJECTIVE: History from: patient.  Stephanie Richards is a 44 y.o. female who presents with complaint of fairly persistent right  sided lower back discomfort. Onset gradual. First noted yesterday. Injury/trama: no. History of back problems requiring medical care: yes; same symptoms. Pain described as aching and without radiation. Aggravating factors: certain movements and prolonged walking/standing. Alleviating factors: rest. Progressive LE weakness or saddle anesthesia: none. Extremity sensation changes or weakness: none. Ambulatory without difficulty. Normal bowel/bladder habits: yes; without urinary retention. Normal PO intake without n/v. No associated abdominal pain/n/v. Self treatment: has OTC analgesics with little relief.  Reports no chronic steroid use, fevers, IV drug use, or recent back surgeries or procedures.  Also reports mild but persistent wheezing over the past several days. Out of albuterol inhaler. Requests refill. No recent illnesses. Afebrile. No current SOB.  OBJECTIVE:  Vitals:   09/08/19 1206  BP: 100/67  Pulse: 75  Resp: 19  Temp: 98.5 F (36.9 C)  TempSrc: Oral  SpO2: 100%    General appearance: alert; no distress HEENT: [place]; AT Neck: supple with FROM; without midline tenderness CV: RRR Lungs: unlabored respirations; speaks full sentences without difficulty Abdomen: soft, non-tender; non-distended Back: moderate and poorly localized tenderness to palpation over right lumbar paraspinal musculature; FROM at waist; bruising: none; without midline tenderness Extremities: without edema; symmetrical without gross deformities; normal ROM of bilateral LE Skin: warm and dry Neurologic: normal gait; normal sensation and strength of bilateral LE Psychological: alert and cooperative; normal mood and affect   No Known Allergies  Past Medical History:  Diagnosis Date  .  Arthritis   . Asthma    Social History   Socioeconomic History  . Marital status: Single    Spouse name: Not on file  . Number of children: Not on file  . Years of education: Not on file  . Highest education level: Not on  file  Occupational History  . Not on file  Tobacco Use  . Smoking status: Current Every Day Smoker    Packs/day: 0.50    Types: Cigarettes  . Smokeless tobacco: Never Used  Substance and Sexual Activity  . Alcohol use: Yes  . Drug use: No  . Sexual activity: Not on file  Other Topics Concern  . Not on file  Social History Narrative  . Not on file   Social Determinants of Health   Financial Resource Strain:   . Difficulty of Paying Living Expenses: Not on file  Food Insecurity:   . Worried About Programme researcher, broadcasting/film/video in the Last Year: Not on file  . Ran Out of Food in the Last Year: Not on file  Transportation Needs:   . Lack of Transportation (Medical): Not on file  . Lack of Transportation (Non-Medical): Not on file  Physical Activity:   . Days of Exercise per Week: Not on file  . Minutes of Exercise per Session: Not on file  Stress:   . Feeling of Stress : Not on file  Social Connections:   . Frequency of Communication with Friends and Family: Not on file  . Frequency of Social Gatherings with Friends and Family: Not on file  . Attends Religious Services: Not on file  . Active Member of Clubs or Organizations: Not on file  . Attends Banker Meetings: Not on file  . Marital Status: Not on file  Intimate Partner Violence:   . Fear of Current or Ex-Partner: Not on file  . Emotionally Abused: Not on file  . Physically Abused: Not on file  . Sexually Abused: Not on file   Family History  Family history unknown: Yes   History reviewed. No pertinent surgical history.   Mardella Layman, MD 09/08/19 1330

## 2019-10-03 IMAGING — DX DG CHEST 2V
2 series · 2 of 2 positions shown · non-contrast
Comparison: None.

CLINICAL DATA: Cough, body aches

EXAM:
CHEST - 2 VIEW

[chest pa]
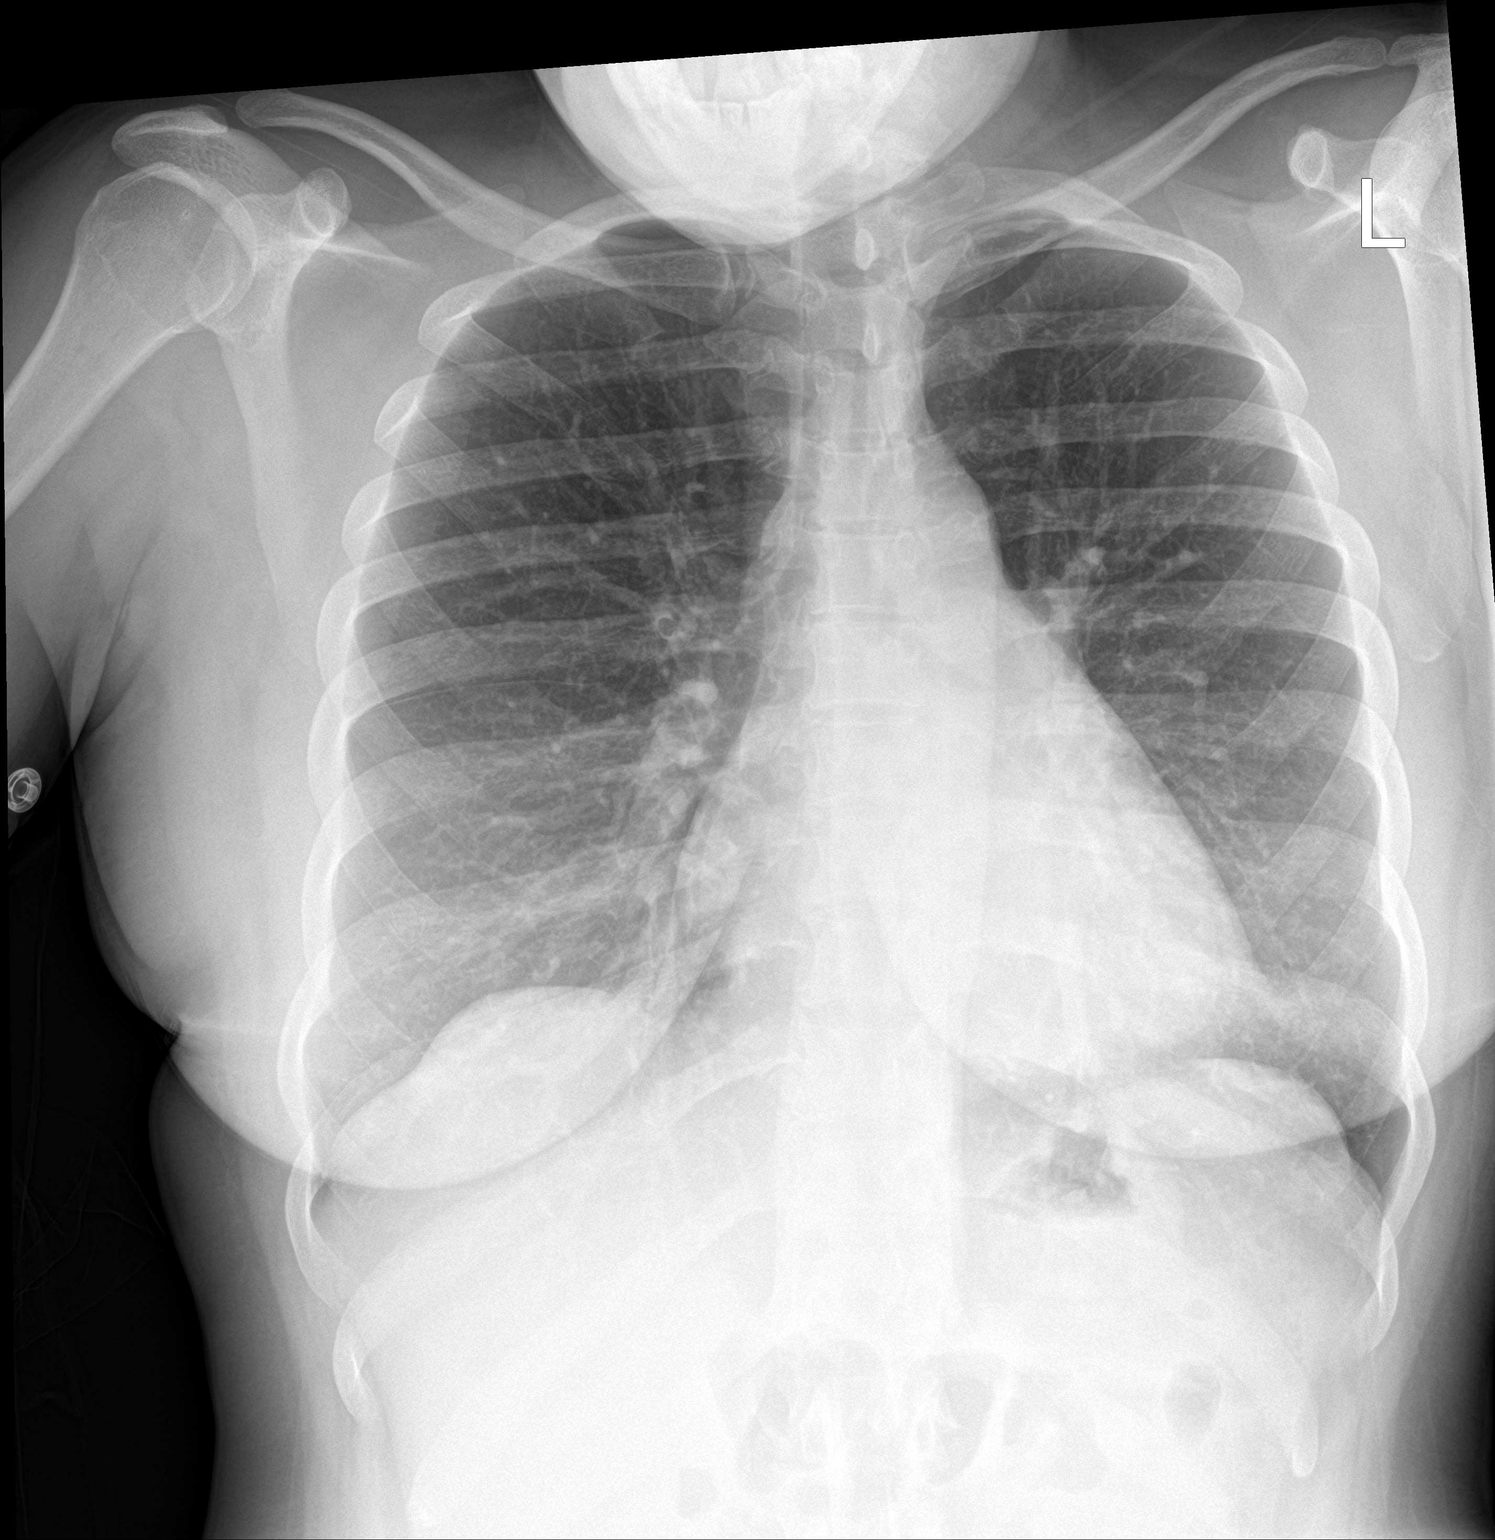

[chest lat]
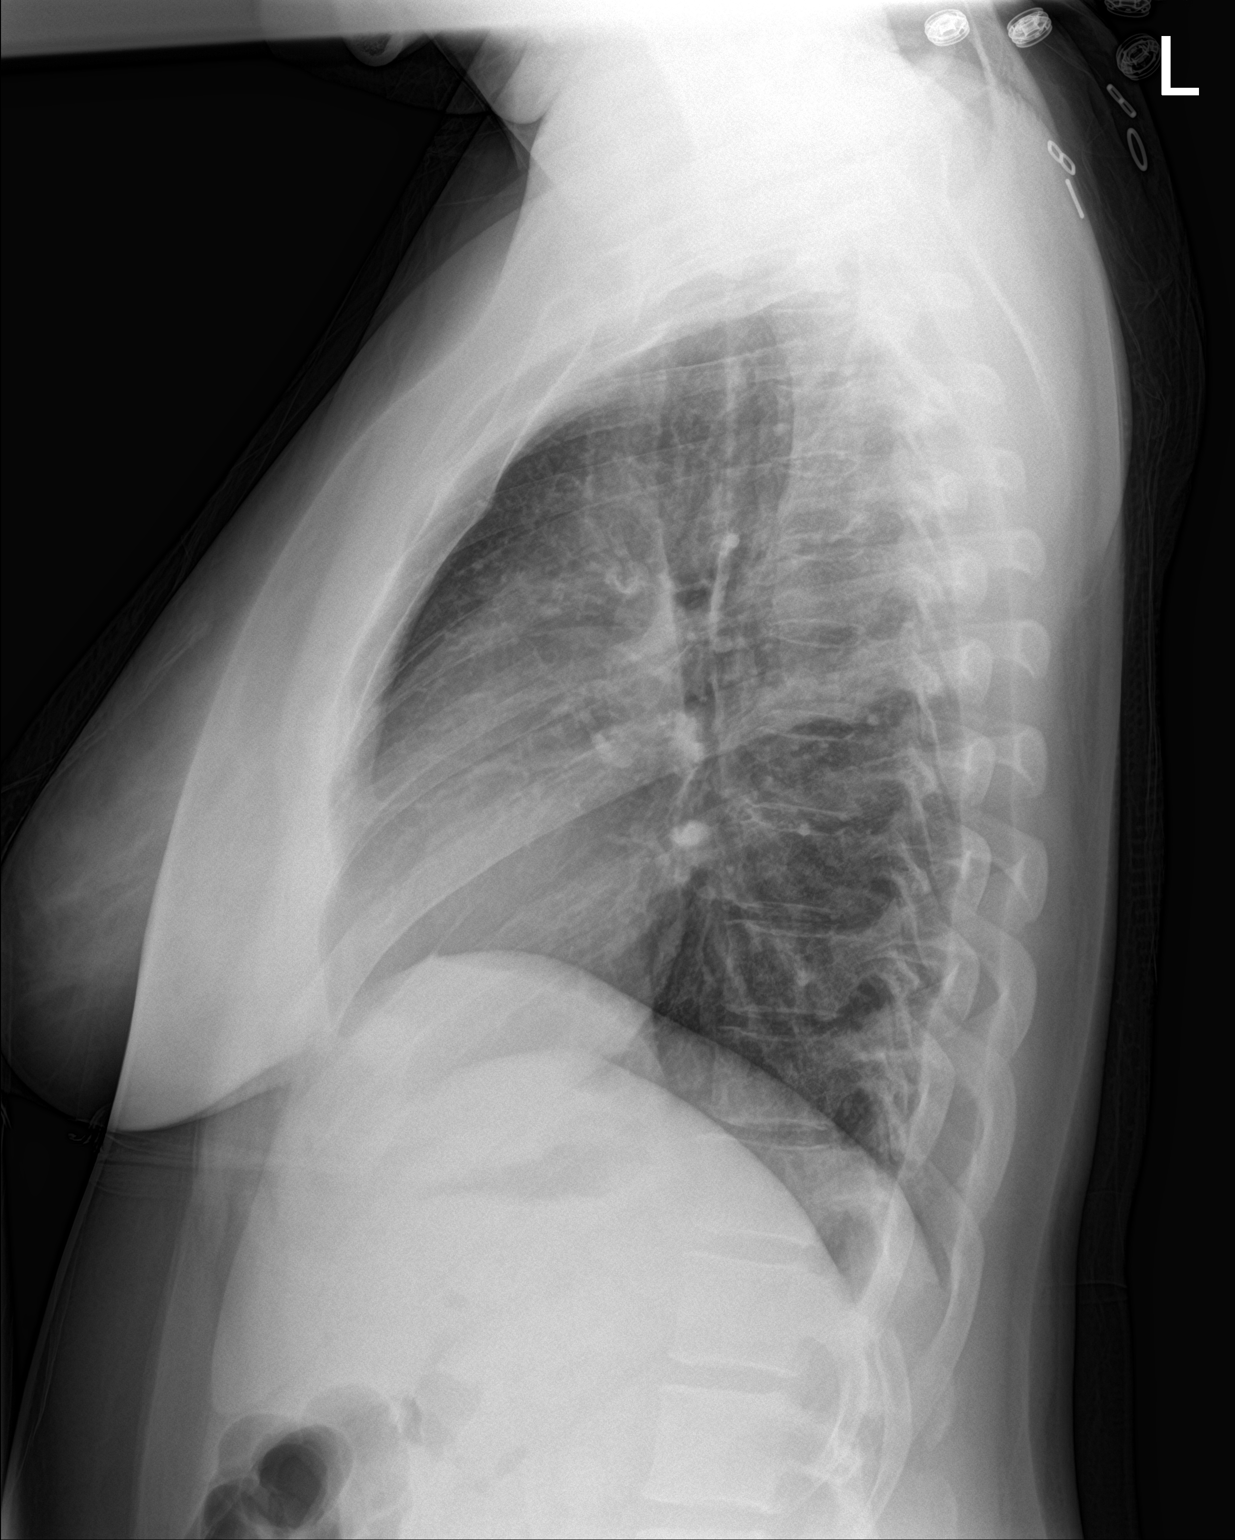

[2 of 2 positions shown; findings below may reference images not displayed]

FINDINGS: Lungs are clear.  No pleural effusion or pneumothorax.

The heart is normal in size.

Visualized osseous structures are within normal limits.
IMPRESSION: Normal chest radiographs.

## 2019-10-14 ENCOUNTER — Ambulatory Visit (HOSPITAL_COMMUNITY)
Admission: EM | Admit: 2019-10-14 | Discharge: 2019-10-14 | Disposition: A | Discharge status: Home / Self Care | Payer: Medicaid Other | Attending: Emergency Medicine | Admitting: Emergency Medicine

## 2019-10-14 ENCOUNTER — Other Ambulatory Visit: Payer: Self-pay

## 2019-10-14 DIAGNOSIS — J36 Peritonsillar abscess: Secondary | ICD-10-CM

## 2019-10-14 MED ORDER — METHYLPREDNISOLONE SODIUM SUCC 125 MG IJ SOLR
125.0000 mg | Freq: Once | INTRAMUSCULAR | Status: AC
  Administered 2019-10-14: 125 mg via INTRAMUSCULAR

## 2019-10-14 MED ORDER — PREDNISONE 50 MG PO TABS: 50.0000 mg | ORAL_TABLET | Freq: Every day | ORAL | 0 refills | Status: AC

## 2019-10-14 MED ORDER — CEFTRIAXONE SODIUM 1 G IJ SOLR
1.0000 g | Freq: Once | INTRAMUSCULAR | Status: AC
  Administered 2019-10-14: 14:00:00 1 g via INTRAMUSCULAR

## 2019-10-14 MED ORDER — AMOXICILLIN-POT CLAVULANATE 400-57 MG/5ML PO SUSR: 10.0000 mL | Freq: Two times a day (BID) | ORAL | 0 refills | Status: AC

## 2019-10-14 MED ORDER — CEFTRIAXONE SODIUM 1 G IJ SOLR
INTRAMUSCULAR | Status: AC
  Filled 2019-10-14: qty 10

## 2019-10-14 MED ORDER — METHYLPREDNISOLONE SODIUM SUCC 125 MG IJ SOLR
INTRAMUSCULAR | Status: AC
  Filled 2019-10-14: qty 2

## 2019-10-14 NOTE — Discharge Instructions (Addendum)
I am treating you for a peritonsillar abscess We gave you an injection of Rocephin (antibiotics) and Solumedrol (steroid for swelling) Continue with Augmentin twice daily for 10 days Prednisone 50 mg daily for swelling- take with food and in the morning   Please follow-up with [facility] if symptoms persisting If developing worsening pain swelling or difficulty breathing please follow-up in the emergency room

## 2019-10-14 NOTE — ED Triage Notes (Signed)
Pt c/o sore throat, HA, congestion, cough onset two days ago. Pt states throat pain increased significantly yesterday and has difficulty swallowing and controlling oral secretions. Denies abdom pain, n/v/d, fever, chills. Right tonsil area inflamed/swollen to midline. Pt states she is just "getting over a cold". Had a negative COVID test "a couple weeks ago" and is declining a covid test at this time.

## 2019-10-14 NOTE — ED Provider Notes (Signed)
[facility]    CSN: [id number] Arrival date & time: 10/14/19  1146      History   Chief Complaint Chief Complaint  Patient presents with  . Sore Throat    HPI Stephanie Richards is a 44 y.o. female history of asthma, presenting today for evaluation of tonsillar pain and swelling.  Patient notes that over the past couple days she has felt under the weather, but in the past 3 to 4 hours developed increased pain and swelling to her right tonsil.  She has significant pain with swallowing and has had decreased oral intake because of this.  She is avoiding swallowing saliva due to discomfort.  She denies any nausea or vomiting.  Denies any known fevers.  Reports she needed to have tonsils removed when she was younger, but this was not performed.  HPI  Past Medical History:  Diagnosis Date  . Arthritis   . Asthma     There are no problems to display for this patient.   No past surgical history on file.  OB History   No obstetric history on file.      Home Medications    Prior to Admission medications   Medication Sig Start Date End Date Taking? Authorizing Provider  albuterol (VENTOLIN HFA) 108 (90 Base) MCG/ACT inhaler Inhale 1-2 puffs into the lungs every 6 (six) hours as needed for wheezing or shortness of breath. 09/08/19   Mardella Layman, MD  amoxicillin-clavulanate (AUGMENTIN) 400-57 MG/5ML suspension Take 10 mLs by mouth 2 (two) times daily for 10 days. 10/14/19 10/24/19  Damari Suastegui C, PA-C  methocarbamol (ROBAXIN) 500 MG tablet Take 1 tablet (500 mg total) by mouth 2 (two) times daily. 09/08/19   Mardella Layman, MD  naproxen (NAPROSYN) 500 MG tablet Take 1 tablet (500 mg total) by mouth 2 (two) times daily. 09/08/19   Mardella Layman, MD  predniSONE (DELTASONE) 50 MG tablet Take 1 tablet (50 mg total) by mouth daily with breakfast for 5 days. 10/14/19 10/19/19  Maysie Parkhill, Junius Creamer, PA-C    Family History Family History  Family history unknown: Yes    Social  History Social History   Tobacco Use  . Smoking status: Current Every Day Smoker    Packs/day: 0.50    Types: Cigarettes  . Smokeless tobacco: Never Used  Substance Use Topics  . Alcohol use: Yes  . Drug use: No     Allergies   Patient has no known allergies.   Review of Systems Review of Systems  Constitutional: Positive for fatigue. Negative for activity change, appetite change, chills and fever.  HENT: Positive for sore throat and trouble swallowing. Negative for congestion, ear pain, rhinorrhea and sinus pressure.   Eyes: Negative for discharge and redness.  Respiratory: Negative for cough, chest tightness and shortness of breath.   Cardiovascular: Negative for chest pain.  Gastrointestinal: Negative for abdominal pain, diarrhea, nausea and vomiting.  Musculoskeletal: Negative for myalgias.  Skin: Negative for rash.  Neurological: Negative for dizziness, light-headedness and headaches.     Physical Exam Triage Vital Signs ED Triage Vitals  Enc Vitals Group     BP 10/14/19 1254 138/84     Pulse Rate 10/14/19 1254 86     Resp 10/14/19 1254 16     Temp 10/14/19 1254 99.1 F (37.3 C)     Temp Source 10/14/19 1254 Oral     SpO2 10/14/19 1254 100 %     Weight --      Height --  Head Circumference --      Peak Flow --      Pain Score 10/14/19 1258 10     Pain Loc --      Pain Edu? --      Excl. in [place]? --    No data found.  Updated Vital Signs BP 138/84 (BP Location: Left Arm)   Pulse 86   Temp 99.1 F (37.3 C) (Oral)   Resp 16   LMP 10/11/2019   SpO2 100%   Visual Acuity Right Eye Distance:   Left Eye Distance:   Bilateral Distance:    Right Eye Near:   Left Eye Near:    Bilateral Near:     Physical Exam Vitals and nursing note reviewed.  Constitutional:      Appearance: She is well-developed.     Comments: No acute distress  HENT:     Head: Normocephalic and atraumatic.     Ears:     Comments: Bilateral ears without tenderness to  palpation of external auricle, tragus and mastoid, EAC's without erythema or swelling, TM's with good bony landmarks and cone of light. Non erythematous.     Nose: Nose normal.     Mouth/Throat:     Comments: Right tonsil with 2-3+ swelling, minimally erythematous, exudate noted, swelling not extending to soft palate or uvula; occasionally spitting saliva Eyes:     Conjunctiva/sclera: Conjunctivae normal.  Neck:     Comments: Full active range of motion of neck,no neck swelling or erythema, tenderness to palpation in right tonsillar area Cardiovascular:     Rate and Rhythm: Normal rate.  Pulmonary:     Effort: Pulmonary effort is normal. No respiratory distress.     Comments: Breathing comfortably at rest, CTABL, no wheezing, rales or other adventitious sounds auscultated Abdominal:     General: There is no distension.  Musculoskeletal:        General: Normal range of motion.     Cervical back: Neck supple.  Skin:    General: Skin is warm and dry.  Neurological:     Mental Status: She is alert and oriented to person, place, and time.      UC Treatments / Results  Labs (all labs ordered are listed, but only abnormal results are displayed) Labs Reviewed - No data to display  EKG   Radiology No results found.  Procedures Procedures (including critical care time)  Medications Ordered in UC Medications  methylPREDNISolone sodium succinate (SOLU-MEDROL) 125 mg/2 mL injection 125 mg (has no administration in time range)  cefTRIAXone (ROCEPHIN) injection 1 g (has no administration in time range)    Initial Impression / Assessment and Plan / UC Course  I have reviewed the triage vital signs and the nursing notes.  Pertinent labs & imaging results that were available during my care of the patient were reviewed by me and considered in my medical decision making (see chart for details).     Exam concerning for peritonsillar abscess, otherwise vital signs stable, breathing  comfortably at rest, avoiding swallowing saliva, but does not appear to have any airway compromise at this time.  Will provide IM Rocephin 1 g, Solu-Medrol to help with swelling.  We will continue with oral Augmentin and prednisone.  Follow-up with ENT if persisting, emergency room if symptoms progressing, worsening or not improving.   Discussed strict return precautions. Patient verbalized understanding and is agreeable with plan.  Final Clinical Impressions(s) / UC Diagnoses   Final diagnoses:  Peritonsillar abscess  Discharge Instructions     I am treating you for a peritonsillar abscess We gave you an injection of Rocephin (antibiotics) and Solumedrol (steroid for swelling) Continue with Augmentin twice daily for 10 days Prednisone 50 mg daily for swelling- take with food and in the morning   Please follow-up with [facility] if symptoms persisting If developing worsening pain swelling or difficulty breathing please follow-up in the emergency room   ED Prescriptions    Medication Sig Dispense Auth. Provider   amoxicillin-clavulanate (AUGMENTIN) 400-57 MG/5ML suspension Take 10 mLs by mouth 2 (two) times daily for 10 days. 200 mL Therese Rocco C, PA-C   predniSONE (DELTASONE) 50 MG tablet Take 1 tablet (50 mg total) by mouth daily with breakfast for 5 days. 5 tablet Liridona Mashaw, [place] C, PA-C     PDMP not reviewed this encounter.   Lew Dawes, [place] 10/14/19 1342

## 2019-10-28 ENCOUNTER — Ambulatory Visit (HOSPITAL_COMMUNITY)
Admission: EM | Admit: 2019-10-28 | Discharge: 2019-10-28 | Disposition: A | Discharge status: Home / Self Care | Payer: Medicaid Other | Attending: Family Medicine | Admitting: Family Medicine

## 2019-10-28 ENCOUNTER — Other Ambulatory Visit: Payer: Self-pay

## 2019-10-28 ENCOUNTER — Ambulatory Visit (INDEPENDENT_AMBULATORY_CARE_PROVIDER_SITE_OTHER): Payer: Medicaid Other

## 2019-10-28 ENCOUNTER — Encounter (HOSPITAL_COMMUNITY): Payer: Self-pay

## 2019-10-28 DIAGNOSIS — S335XXA Sprain of ligaments of lumbar spine, initial encounter: Secondary | ICD-10-CM | POA: Diagnosis not present

## 2019-10-28 DIAGNOSIS — W172XXA Fall into hole, initial encounter: Secondary | ICD-10-CM

## 2019-10-28 DIAGNOSIS — M545 Low back pain: Secondary | ICD-10-CM

## 2019-10-28 MED ORDER — HYDROCODONE-ACETAMINOPHEN 5-325 MG PO TABS
ORAL_TABLET | ORAL | Status: AC
  Filled 2019-10-28: qty 1

## 2019-10-28 MED ORDER — HYDROCODONE-ACETAMINOPHEN 5-325 MG PO TABS: 1.0000 | ORAL_TABLET | Freq: Four times a day (QID) | ORAL | 0 refills | Status: DC | PRN

## 2019-10-28 MED ORDER — TIZANIDINE HCL 4 MG PO TABS: 4.0000 mg | ORAL_TABLET | Freq: Four times a day (QID) | ORAL | 0 refills | Status: DC | PRN

## 2019-10-28 MED ORDER — HYDROCODONE-ACETAMINOPHEN 5-325 MG PO TABS
1.0000 | ORAL_TABLET | Freq: Once | ORAL | Status: AC
  Administered 2019-10-28: 1 via ORAL

## 2019-10-28 NOTE — ED Provider Notes (Addendum)
[facility]    CSN: [id number] Arrival date & time: 10/28/19  1430      History   Chief Complaint Chief Complaint  Patient presents with  . Back Pain  . Leg Injury    HPI Stephanie Richards is a 44 y.o. female.   HPI   Patient states that she was walking.  Inadvertently stepped in a hole.  She thinks it was a sewer or water access opening.  Her foot went down and the hole in her shin has an abrasion on it.  She fell forward and towards her left side.  Now she has pain in her left leg and in her left low back.  She states that she "wrenched" her back towards the left.  She has chronic right back pain.  Now she is very uncomfortable.  No numbness or weakness.  No bowel or bladder complaint.  She is very stiff and sore from her fall earlier today  Past Medical History:  Diagnosis Date  . Arthritis   . Asthma     There are no problems to display for this patient.   History reviewed. No pertinent surgical history.  OB History   No obstetric history on file.      Home Medications    Prior to Admission medications   Medication Sig Start Date End Date Taking? Authorizing Provider  albuterol (VENTOLIN HFA) 108 (90 Base) MCG/ACT inhaler Inhale 1-2 puffs into the lungs every 6 (six) hours as needed for wheezing or shortness of breath. 09/08/19   Mardella Layman, MD  HYDROcodone-acetaminophen (NORCO/VICODIN) 5-325 MG tablet Take 1-2 tablets by mouth every 6 (six) hours as needed. 10/28/19   Eustace Moore, MD  methocarbamol (ROBAXIN) 500 MG tablet Take 1 tablet (500 mg total) by mouth 2 (two) times daily. 09/08/19   Mardella Layman, MD  naproxen (NAPROSYN) 500 MG tablet Take 1 tablet (500 mg total) by mouth 2 (two) times daily. 09/08/19   Mardella Layman, MD  tiZANidine (ZANAFLEX) 4 MG tablet Take 1-2 tablets (4-8 mg total) by mouth every 6 (six) hours as needed for muscle spasms. 10/28/19   Eustace Moore, MD    Family History Family History  Family history unknown: Yes     Social History Social History   Tobacco Use  . Smoking status: Current Every Day Smoker    Packs/day: 0.50    Types: Cigarettes  . Smokeless tobacco: Never Used  Substance Use Topics  . Alcohol use: Yes  . Drug use: No     Allergies   Patient has no known allergies.   Review of Systems Review of Systems  Musculoskeletal: Positive for back pain.  Skin: Positive for wound.     Physical Exam Triage Vital Signs ED Triage Vitals  Enc Vitals Group     BP 10/28/19 1543 122/78     Pulse Rate 10/28/19 1543 (!) 104     Resp 10/28/19 1543 (!) 22     Temp 10/28/19 1543 99 F (37.2 C)     Temp Source 10/28/19 1543 Oral     SpO2 10/28/19 1543 98 %     Weight --      Height --      Head Circumference --      Peak Flow --      Pain Score 10/28/19 1541 10     Pain Loc --      Pain Edu? --      Excl. in [place]? --  No data found.  Updated Vital Signs BP 122/78 (BP Location: Right Arm)   Pulse (!) 104   Temp 99 F (37.2 C) (Oral)   Resp (!) 22   LMP 10/11/2019   SpO2 98%      Physical Exam Constitutional:      General: She is not in acute distress.    Appearance: She is well-developed.     Comments: Appears uncomfortable.  Is leaning across the exam table  HENT:     Head: Normocephalic and atraumatic.     Mouth/Throat:     Comments: Mask in place Eyes:     Conjunctiva/sclera: Conjunctivae normal.     Pupils: Pupils are equal, round, and reactive to light.  Cardiovascular:     Rate and Rhythm: Normal rate.  Pulmonary:     Effort: Pulmonary effort is normal. No respiratory distress.  Musculoskeletal:        General: Normal range of motion.     Cervical back: Normal range of motion.       Legs:     Comments: Tenderness centrally in the lumbar spine.  Tenderness in the left SI region.  No palpable muscle spasm.  Patient is very limited in her range of motion and resists coming fully erect.  Strength sensation range of motion and reflexes are normal in lower  extremities.  Skin:    General: Skin is warm and dry.  Neurological:     Mental Status: She is alert.  Psychiatric:        Mood and Affect: Mood normal.        Behavior: Behavior normal.      UC Treatments / Results  Labs (all labs ordered are listed, but only abnormal results are displayed) Labs Reviewed - No data to display  EKG   Radiology DG Lumbar Spine Complete  Result Date: 10/28/2019 CLINICAL DATA:  Lower back pain after fall today. EXAM: LUMBAR SPINE - COMPLETE 4+ VIEW COMPARISON:  None. FINDINGS: There is no evidence of lumbar spine fracture. Alignment is normal. Intervertebral disc spaces are maintained. IMPRESSION: Negative. Electronically Signed   By: Lupita Raider M.D.   On: 10/28/2019 16:40    Procedures Procedures (including critical care time)  Medications Ordered in UC Medications  HYDROcodone-acetaminophen (NORCO/VICODIN) 5-325 MG per tablet 1 tablet (1 tablet Oral Given 10/28/19 1609)    Initial Impression / Assessment and Plan / UC Course  I have reviewed the triage vital signs and the nursing notes.  Pertinent labs & imaging results that were available during my care of the patient were reviewed by me and considered in my medical decision making (see chart for details).     I discussed with patient normal x-rays.  Conservative management of low back pain.  Wound care.  Tetanus up-to-date Final Clinical Impressions(s) / UC Diagnoses   Final diagnoses:  Lumbar sprain, initial encounter  Fall into hole as cause of accidental injury     Discharge Instructions     Home to rest Put ice on painful areas Take pain medication as needed Do not drive on pain medication take muscle relaxer as needed, this is helpful at bedtime Call your primary care doctor or return here if not improving by Monday       ED Prescriptions    Medication Sig Dispense Auth. Provider   HYDROcodone-acetaminophen (NORCO/VICODIN) 5-325 MG tablet Take 1-2 tablets by  mouth every 6 (six) hours as needed. 15 tablet Eustace Moore, MD   tiZANidine (  ZANAFLEX) 4 MG tablet Take 1-2 tablets (4-8 mg total) by mouth every 6 (six) hours as needed for muscle spasms. 21 tablet Raylene Everts, MD     I have reviewed the PDMP during this encounter.   Raylene Everts, MD 10/28/19 Merrily Pew    Raylene Everts, MD 10/28/19 [phone]

## 2019-10-28 NOTE — ED Triage Notes (Signed)
Pt reports she fell into a pot hole this afternoon. Reports left-sided lower back pain and left leg/foot pain.

## 2019-10-28 NOTE — Discharge Instructions (Signed)
Home to rest Put ice on painful areas Take pain medication as needed Do not drive on pain medication take muscle relaxer as needed, this is helpful at bedtime Call your primary care doctor or return here if not improving by Monday

## 2019-11-01 ENCOUNTER — Encounter (HOSPITAL_COMMUNITY): Payer: Self-pay | Admitting: Emergency Medicine

## 2019-11-01 ENCOUNTER — Other Ambulatory Visit: Payer: Self-pay

## 2019-11-01 ENCOUNTER — Ambulatory Visit (HOSPITAL_COMMUNITY)
Admission: EM | Admit: 2019-11-01 | Discharge: 2019-11-01 | Disposition: A | Discharge status: Home / Self Care | Payer: Medicaid Other

## 2019-11-01 DIAGNOSIS — M79645 Pain in left finger(s): Secondary | ICD-10-CM | POA: Diagnosis not present

## 2019-11-01 DIAGNOSIS — M545 Low back pain, unspecified: Secondary | ICD-10-CM

## 2019-11-01 NOTE — Discharge Instructions (Signed)
Take the ibuprofen as prescribed.  Apply ice packs 2-3 times a day for up to 20 minutes each.   Follow up with your primary care provider or an orthopedist if you symptoms continue or worsen;  Or if you develop new symptoms, such as numbness, tingling, or weakness.    

## 2019-11-01 NOTE — ED Triage Notes (Signed)
Pt here after falling last week injuring her lower back; pt sts some improvement but requesting note for work and also to look at her left middle finger that has some swelling; pt unsure if she injured during fall

## 2019-11-01 NOTE — ED Provider Notes (Signed)
[facility]    CSN: [id number] Arrival date & time: 11/01/19  1519      History   Chief Complaint Chief Complaint  Patient presents with  . Back Pain  . Hand Pain    HPI Stephanie Richards is a 44 y.o. female.   Patient reports that she fell last week and hurt her lower back, and her left middle finger.  She reports that she has been improving with pain and has been able to walk around a little bit more.  Patient reports that she does not feel like she can go to work and stand and lift boxes that she does at her job.  Patient is requesting a note for her absence from work.  Patient reports that she still has pain medication muscle relaxers that she was given at her visit here last week.  Patient reports that she really thinks that they are working and she still has some, she is not asking for refills.  Denies headache, cough, shortness of breath, nausea, vomiting, diarrhea, chills, body aches, rash, fever, other symptoms.  ROS per HPI  The history is provided by the patient.    Past Medical History:  Diagnosis Date  . Arthritis   . Asthma     There are no problems to display for this patient.   History reviewed. No pertinent surgical history.  OB History   No obstetric history on file.      Home Medications    Prior to Admission medications   Medication Sig Start Date End Date Taking? Authorizing Provider  albuterol (VENTOLIN HFA) 108 (90 Base) MCG/ACT inhaler Inhale 1-2 puffs into the lungs every 6 (six) hours as needed for wheezing or shortness of breath. 09/08/19   Mardella Layman, MD  HYDROcodone-acetaminophen (NORCO/VICODIN) 5-325 MG tablet Take 1-2 tablets by mouth every 6 (six) hours as needed. 10/28/19   Eustace Moore, MD  methocarbamol (ROBAXIN) 500 MG tablet Take 1 tablet (500 mg total) by mouth 2 (two) times daily. Patient not taking: Reported on 11/01/2019 09/08/19   Mardella Layman, MD  naproxen (NAPROSYN) 500 MG tablet Take 1 tablet (500 mg  total) by mouth 2 (two) times daily. 09/08/19   Mardella Layman, MD  tiZANidine (ZANAFLEX) 4 MG tablet Take 1-2 tablets (4-8 mg total) by mouth every 6 (six) hours as needed for muscle spasms. 10/28/19   Eustace Moore, MD    Family History Family History  Family history unknown: Yes    Social History Social History   Tobacco Use  . Smoking status: Current Every Day Smoker    Packs/day: 0.50    Types: Cigarettes  . Smokeless tobacco: Never Used  Substance Use Topics  . Alcohol use: Yes  . Drug use: No     Allergies   Patient has no known allergies.   Review of Systems Review of Systems   Physical Exam Triage Vital Signs ED Triage Vitals  Enc Vitals Group     BP 11/01/19 1547 (!) 126/55     Pulse Rate 11/01/19 1547 96     Resp 11/01/19 1547 18     Temp 11/01/19 1547 98 F (36.7 C)     Temp Source 11/01/19 1547 Oral     SpO2 11/01/19 1547 98 %     Weight --      Height --      Head Circumference --      Peak Flow --      Pain Score 11/01/19  1548 6     Pain Loc --      Pain Edu? --      Excl. in [place]? --    No data found.  Updated Vital Signs BP (!) 126/55 (BP Location: Right Arm)   Pulse 96   Temp 98 F (36.7 C) (Oral)   Resp 18   LMP 10/11/2019   SpO2 98%   Visual Acuity Right Eye Distance:   Left Eye Distance:   Bilateral Distance:    Right Eye Near:   Left Eye Near:    Bilateral Near:     Physical Exam Vitals and nursing note reviewed.  Constitutional:      General: She is not in acute distress.    Appearance: Normal appearance. She is well-developed and normal weight. She is not ill-appearing.  HENT:     Head: Normocephalic and atraumatic.     Nose: Nose normal.  Eyes:     Conjunctiva/sclera: Conjunctivae normal.  Cardiovascular:     Rate and Rhythm: Normal rate and regular rhythm.     Heart sounds: Normal heart sounds. No murmur.  Pulmonary:     Effort: Pulmonary effort is normal. No respiratory distress.     Breath sounds: No  stridor. No wheezing, rhonchi or rales.  Chest:     Chest wall: No tenderness.  Abdominal:     General: Bowel sounds are normal.     Palpations: Abdomen is soft.     Tenderness: There is no abdominal tenderness.  Musculoskeletal:        General: Tenderness (Lower back) present. Normal range of motion.     Cervical back: Normal range of motion and neck supple.  Skin:    General: Skin is warm and dry.     Capillary Refill: Capillary refill takes less than 2 seconds.  Neurological:     General: No focal deficit present.     Mental Status: She is alert and oriented to person, place, and time.  Psychiatric:        Mood and Affect: Mood normal.        Behavior: Behavior normal.        Thought Content: Thought content normal.      UC Treatments / Results  Labs (all labs ordered are listed, but only abnormal results are displayed) Labs Reviewed - No data to display  EKG   Radiology No results found.  Procedures Procedures (including critical care time)  Medications Ordered in UC Medications - No data to display  Initial Impression / Assessment and Plan / UC Course  I have reviewed the triage vital signs and the nursing notes.  Pertinent labs & imaging results that were available during my care of the patient were reviewed by me and considered in my medical decision making (see chart for details).     Low back pain: Still taking Norco and tizanidine and these are helping with her pain and muscle spasms.  Still believe the patient is not able to go and repeatedly lift boxes at work.  Will provide note to go back to work on Thursday the 15th.  Instructed patient to continue Norco and tizanidine as needed.  Instructed her to continue with warm baths and using heat on her back.  Instructed patient to continue to be aware of what she is doing with her body, and not to overdo it while we are waiting for her back to heal.  Patient verbalizes instructions and agrees to treatment plan.   Patient  instructed to follow-up with the ER for trouble swallowing, trouble breathing, loss of sensation in the limbs, loss of bowel or bladder function, or other concerning symptoms. Final Clinical Impressions(s) / UC Diagnoses   Final diagnoses:  Low back pain, unspecified back pain laterality, unspecified chronicity, unspecified whether sciatica present  Pain of left middle finger     Discharge Instructions     Take the ibuprofen as prescribed. Apply ice packs 2-3 times a day for up to 20 minutes each.   Follow up with your primary care provider or an orthopedist if you symptoms continue or worsen;  Or if you develop new symptoms, such as numbness, tingling, or weakness.       ED Prescriptions    None     PDMP not reviewed this encounter.   Faustino Congress, NP 11/01/19 1754

## 2019-11-03 ENCOUNTER — Encounter (HOSPITAL_COMMUNITY): Payer: Self-pay

## 2019-11-03 ENCOUNTER — Ambulatory Visit (INDEPENDENT_AMBULATORY_CARE_PROVIDER_SITE_OTHER): Payer: Medicaid Other

## 2019-11-03 ENCOUNTER — Other Ambulatory Visit: Payer: Self-pay

## 2019-11-03 ENCOUNTER — Ambulatory Visit (HOSPITAL_COMMUNITY)
Admission: EM | Admit: 2019-11-03 | Discharge: 2019-11-03 | Disposition: A | Discharge status: Home / Self Care | Payer: Medicaid Other | Attending: Emergency Medicine | Admitting: Emergency Medicine

## 2019-11-03 DIAGNOSIS — L03012 Cellulitis of left finger: Secondary | ICD-10-CM

## 2019-11-03 DIAGNOSIS — S6992XA Unspecified injury of left wrist, hand and finger(s), initial encounter: Secondary | ICD-10-CM | POA: Diagnosis not present

## 2019-11-03 DIAGNOSIS — M79645 Pain in left finger(s): Secondary | ICD-10-CM | POA: Diagnosis not present

## 2019-11-03 MED ORDER — ACETAMINOPHEN 325 MG PO TABS
ORAL_TABLET | ORAL | Status: AC
  Filled 2019-11-03: qty 3

## 2019-11-03 MED ORDER — ACETAMINOPHEN 325 MG PO TABS
975.0000 mg | ORAL_TABLET | Freq: Once | ORAL | Status: AC
  Administered 2019-11-03: 975 mg via ORAL

## 2019-11-03 MED ORDER — HYDROCODONE-ACETAMINOPHEN 5-325 MG PO TABS: 1.0000 | ORAL_TABLET | Freq: Four times a day (QID) | ORAL | 0 refills | Status: DC | PRN

## 2019-11-03 MED ORDER — DOXYCYCLINE HYCLATE 100 MG PO CAPS: 100.0000 mg | ORAL_CAPSULE | Freq: Two times a day (BID) | ORAL | 0 refills | Status: AC

## 2019-11-03 MED ORDER — KETOROLAC TROMETHAMINE 30 MG/ML IJ SOLN
30.0000 mg | Freq: Once | INTRAMUSCULAR | Status: AC
  Administered 2019-11-03: 30 mg via INTRAMUSCULAR

## 2019-11-03 MED ORDER — KETOROLAC TROMETHAMINE 30 MG/ML IJ SOLN
INTRAMUSCULAR | Status: AC
  Filled 2019-11-03: qty 1

## 2019-11-03 MED ORDER — IBUPROFEN 600 MG PO TABS: 600.0000 mg | ORAL_TABLET | Freq: Four times a day (QID) | ORAL | 0 refills | Status: DC | PRN

## 2019-11-03 NOTE — ED Triage Notes (Signed)
Pt c/o left third finger swelling/pain. Pt states she fell in a pothole on 04/08 and began having pain in finger a few days afterwards, but states swelling/pain increased yesterday. Finger appears edematous with erythema.

## 2019-11-03 NOTE — Discharge Instructions (Addendum)
I am concerned that she had a fingertip infection called a felon.  I have talked to Dr. Melvyn Novas, hand surgeon, he will see you tomorrow.  I am starting you on doxycycline for the next 10 days.  Finish the doxycycline unless he tells you to stop.  You can take 600 mg of ibuprofen combined with a Tylenol containing product 3 or 4 times a day as needed for pain.  Either ibuprofen with 1000 mg of regular Tylenol for mild to moderate pain or ibuprofen with 1-2 Norco for severe pain.  Go to the ER for fevers above 100.4, pain not controlled with medications or for other concerns.  Below is a list of primary care practices who are taking new patients for you to follow-up with.  [facility] internal medicine clinic Ground Floor - [facility], [address] [place], [place], [place] [address] [phone]  [facility] [address] [place], [place] [address] [phone]  [facility] [address] [place], [place] [address] [phone]  [facility] [phone] [address] [place] [address]  [facility]: [address] [place] [address]  [phone]  [facility]: [address] [place] [address]   [phone]  [facility]: [address] [place] [address]  [phone]  [facility] : [address] [place] [address] [phone]  [facility]: [address] [place] [address] [phone]  [facility]: [address] [place] [address] [phone]  Dr. Oneal Grout [address] [facility] [place] [address]  [phone]  Dr. Jackie Plum, [facility]. [address] [place],  [place] [address]  [phone]  Go to www.goodrx.com to look up your medications. This will give you a list of where you can find your prescriptions at the most affordable prices. Or ask the pharmacist what the cash price is, or if they have any other discount programs available to help make your medication more affordable. This can be less expensive than what you would pay with insurance.

## 2019-11-03 NOTE — ED Provider Notes (Signed)
HPI  SUBJECTIVE:  Stephanie Richards is a left-handed 44 y.o. female who presents with left middle finger pain, swelling at the distal fingertip for over a week.  It is throbbing, intermittent, lasting minutes, but coming frequently.  She states that she fell in a pothole on 4/8 and the pain and swelling started several days after that.  Does not recall any direct trauma to the finger itself.  She reports erythema of the distal fingertip.  It is not moving up the finger.  No fevers, body aches, numbness, tingling, purulent drainage.  Skin is intact.  She has never had symptoms like this before.  Denies vesicles.  No antipyretic in the past 4 to 6 hours.  She has tried ice, leftover prescription of Norco, muscle relaxant.  The Norco and ice helped very temporarily.  Symptoms worse with moving it, straightening her finger out, palpation.  She smokes.  No history of diabetes hypertension immunocompromise.  LMP: Last month.  Denies the possibility of being pregnant.  PMD: None.  Patient was seen here on 4/8 after inadvertently stepping in a pothole.  States that she fell, not sure if she hit her hand or not.  She injured her back and was sent home on Zanaflex, Norco.  Returned on 4/12 for work note.  Past Medical History:  Diagnosis Date  . Arthritis   . Asthma     History reviewed. No pertinent surgical history.  Family History  Problem Relation Age of Onset  . Healthy Mother   . Healthy Father     Social History   Tobacco Use  . Smoking status: Current Every Day Smoker    Packs/day: 0.50    Types: Cigarettes  . Smokeless tobacco: Never Used  Substance Use Topics  . Alcohol use: Yes  . Drug use: No    No current facility-administered medications for this encounter.  Current Outpatient Medications:  .  cyclobenzaprine (FLEXERIL) 5 MG tablet, Take by mouth., Disp: , Rfl:  .  albuterol (VENTOLIN HFA) 108 (90 Base) MCG/ACT inhaler, Inhale 1-2 puffs into the lungs every 6 (six) hours as  needed for wheezing or shortness of breath., Disp: 18 g, Rfl: 2 .  doxycycline (VIBRAMYCIN) 100 MG capsule, Take 1 capsule (100 mg total) by mouth 2 (two) times daily for 7 days., Disp: 14 capsule, Rfl: 0 .  HYDROcodone-acetaminophen (NORCO/VICODIN) 5-325 MG tablet, Take 1-2 tablets by mouth every 6 (six) hours as needed for moderate pain or severe pain., Disp: 12 tablet, Rfl: 0 .  ibuprofen (ADVIL) 600 MG tablet, Take 1 tablet (600 mg total) by mouth every 6 (six) hours as needed., Disp: 30 tablet, Rfl: 0 .  tiZANidine (ZANAFLEX) 4 MG tablet, Take 1-2 tablets (4-8 mg total) by mouth every 6 (six) hours as needed for muscle spasms., Disp: 21 tablet, Rfl: 0  No Known Allergies   ROS  As noted in HPI.   Physical Exam  BP 128/89 (BP Location: Right Arm)   Pulse 86   Temp 98.5 F (36.9 C) (Oral)   Resp 18   LMP 10/11/2019 (Exact Date)   SpO2 100%   Constitutional: Well developed, well nourished, no acute distress Eyes:  EOMI, conjunctiva normal bilaterally HENT: Normocephalic, atraumatic,mucus membranes moist Respiratory: Normal inspiratory effort Cardiovascular: Normal rate GI: nondistended skin: No rash, skin intact Musculoskeletal: Pain erythema tight swelling and exquisite tenderness at the distal tip of the left middle finger.  Positive diffuse swelling of the middle finger.  No tenderness along the  flexor tendons.  No tenderness along the proximal phalanx, middle phalanx, PIP, DIP.  Nail normal.  No paronychia.  No subungual hematoma.  Pain with passive extension.  Patient holding finger in flexion.       Neurologic: Alert & oriented x 3, no focal neuro deficits Psychiatric: Speech and behavior appropriate   ED Course   Medications  ketorolac (TORADOL) 30 MG/ML injection 30 mg (30 mg Intramuscular Given 11/03/19 1730)  acetaminophen (TYLENOL) tablet 975 mg (975 mg Oral Given 11/03/19 1730)    Orders Placed This Encounter  Procedures  . DG Finger Middle Left     Standing Status:   Standing    Number of Occurrences:   1    Order Specific Question:   Reason for Exam (SYMPTOM  OR DIAGNOSIS REQUIRED)    Answer:   r/o fx. s/p trauma    No results found for this or any previous visit (from the past 31 hour(s)). DG Finger Middle Left  Result Date: 11/03/2019 CLINICAL DATA:  Fall 1 week ago with third digit pain, initial encounter EXAM: LEFT MIDDLE FINGER 2+V COMPARISON:  None. FINDINGS: On the oblique image there is a tiny bony density identified adjacent to the third MCP joint which may represent a small avulsion from the base of the proximal phalanx. No other fracture or dislocation is seen. Mild soft tissue swelling is noted. IMPRESSION: Tiny bony density adjacent to the third MCP joint which may represent a small avulsion fracture. Electronically Signed   By: Inez Catalina M.D.   On: 11/03/2019 18:09    ED Clinical Impression  1. Felon of finger of left hand      ED Assessment/Plan  Previous records reviewed.  As noted in HPI.  Suspect felon.  Flexor tenosynovitis in the differential.  X-raying hand  To r/o fracture because she thinks that she may have hit her finger when she fell on 4/8 however her finger did not look like this on her previous 2 visits.  Giving patient Toradol 30 mg IM and 975 mg of Tylenol p.o.  [place] Narcotic database reviewed for this patient, and feel that the risk/benefit ratio today is favorable for proceeding with a prescription for controlled substance.  No opiate prescriptions on record in 2 years.  Reviewed imaging independently.  Tiny bony density adjacent to the third MCP joint which may represent a small avulsion fracture.  See radiology report for full details.  Patient has no tenderness at the MCP joint.  All of her pain is at the distal fingertip.   Discussed with Dr. Caralyn Guile.  He will see her tomorrow in the office.  In the meantime, will treat as a felon with doxycycline for 10 days, home with ibuprofen/ Tylenol or  ibuprofen/Norco.  Discussed  imaging, MDM, treatment plan, and plan for follow-up with patient. Discussed sn/sx that should prompt return to the ED. patient agrees with plan.   Meds ordered this encounter  Medications  . ketorolac (TORADOL) 30 MG/ML injection 30 mg  . acetaminophen (TYLENOL) tablet 975 mg  . doxycycline (VIBRAMYCIN) 100 MG capsule    Sig: Take 1 capsule (100 mg total) by mouth 2 (two) times daily for 7 days.    Dispense:  14 capsule    Refill:  0  . HYDROcodone-acetaminophen (NORCO/VICODIN) 5-325 MG tablet    Sig: Take 1-2 tablets by mouth every 6 (six) hours as needed for moderate pain or severe pain.    Dispense:  12 tablet    Refill:  0  . ibuprofen (ADVIL) 600 MG tablet    Sig: Take 1 tablet (600 mg total) by mouth every 6 (six) hours as needed.    Dispense:  30 tablet    Refill:  0    *This clinic note was created using Scientist, clinical (histocompatibility and immunogenetics). Therefore, there may be occasional mistakes despite careful proofreading.   ?    Domenick Gong, MD 11/04/19 [phone]

## 2019-11-04 DIAGNOSIS — L03012 Cellulitis of left finger: Secondary | ICD-10-CM | POA: Diagnosis not present

## 2019-11-09 DIAGNOSIS — L03012 Cellulitis of left finger: Secondary | ICD-10-CM | POA: Diagnosis not present

## 2019-11-09 DIAGNOSIS — Z4789 Encounter for other orthopedic aftercare: Secondary | ICD-10-CM | POA: Diagnosis not present

## 2019-11-17 ENCOUNTER — Ambulatory Visit (HOSPITAL_BASED_OUTPATIENT_CLINIC_OR_DEPARTMENT_OTHER): Payer: Medicaid Other | Admitting: Anesthesiology

## 2019-11-17 ENCOUNTER — Encounter (HOSPITAL_BASED_OUTPATIENT_CLINIC_OR_DEPARTMENT_OTHER): Payer: Self-pay | Admitting: Orthopedic Surgery

## 2019-11-17 ENCOUNTER — Other Ambulatory Visit (HOSPITAL_COMMUNITY)
Admission: RE | Admit: 2019-11-17 | Discharge: 2019-11-17 | Disposition: A | Discharge status: Home / Self Care | Payer: Medicaid Other | Source: Ambulatory Visit | Attending: Orthopedic Surgery | Admitting: Orthopedic Surgery

## 2019-11-17 ENCOUNTER — Other Ambulatory Visit: Payer: Self-pay

## 2019-11-17 ENCOUNTER — Ambulatory Visit (HOSPITAL_BASED_OUTPATIENT_CLINIC_OR_DEPARTMENT_OTHER)
Admission: RE | Admit: 2019-11-17 | Discharge: 2019-11-17 | Disposition: A | Discharge status: Home / Self Care | Payer: Medicaid Other | Attending: Orthopedic Surgery | Admitting: Orthopedic Surgery

## 2019-11-17 ENCOUNTER — Encounter (HOSPITAL_BASED_OUTPATIENT_CLINIC_OR_DEPARTMENT_OTHER)
Admission: RE | Disposition: A | Discharge status: Home / Self Care | Payer: Self-pay | Source: Home / Self Care | Attending: Orthopedic Surgery

## 2019-11-17 DIAGNOSIS — L03012 Cellulitis of left finger: Secondary | ICD-10-CM | POA: Insufficient documentation

## 2019-11-17 DIAGNOSIS — Z20822 Contact with and (suspected) exposure to covid-19: Secondary | ICD-10-CM | POA: Insufficient documentation

## 2019-11-17 DIAGNOSIS — F17210 Nicotine dependence, cigarettes, uncomplicated: Secondary | ICD-10-CM | POA: Insufficient documentation

## 2019-11-17 DIAGNOSIS — M65142 Other infective (teno)synovitis, left hand: Secondary | ICD-10-CM | POA: Diagnosis not present

## 2019-11-17 DIAGNOSIS — M86142 Other acute osteomyelitis, left hand: Secondary | ICD-10-CM | POA: Diagnosis not present

## 2019-11-17 DIAGNOSIS — M869 Osteomyelitis, unspecified: Secondary | ICD-10-CM | POA: Diagnosis not present

## 2019-11-17 DIAGNOSIS — L03124 Acute lymphangitis of left upper limb: Secondary | ICD-10-CM | POA: Diagnosis not present

## 2019-11-17 DIAGNOSIS — M1990 Unspecified osteoarthritis, unspecified site: Secondary | ICD-10-CM | POA: Diagnosis not present

## 2019-11-17 DIAGNOSIS — J45909 Unspecified asthma, uncomplicated: Secondary | ICD-10-CM | POA: Diagnosis not present

## 2019-11-17 HISTORY — PX: INCISION AND DRAINAGE: SHX5863

## 2019-11-17 LAB — RESPIRATORY PANEL BY RT PCR (FLU A&B, COVID)
Influenza A by PCR: NEGATIVE
Influenza B by PCR: NEGATIVE
SARS Coronavirus 2 by RT PCR: NEGATIVE

## 2019-11-17 LAB — POCT PREGNANCY, URINE: Preg Test, Ur: NEGATIVE

## 2019-11-17 SURGERY — INCISION AND DRAINAGE
Anesthesia: Monitor Anesthesia Care | Site: Finger | Laterality: Left

## 2019-11-17 MED ORDER — ONDANSETRON HCL 4 MG/2ML IJ SOLN
INTRAMUSCULAR | Status: DC | PRN
  Administered 2019-11-17: 4 mg via INTRAVENOUS

## 2019-11-17 MED ORDER — MIDAZOLAM HCL 2 MG/2ML IJ SOLN
INTRAMUSCULAR | Status: AC
  Filled 2019-11-17: qty 2

## 2019-11-17 MED ORDER — FENTANYL CITRATE (PF) 100 MCG/2ML IJ SOLN
INTRAMUSCULAR | Status: AC
  Filled 2019-11-17: qty 2

## 2019-11-17 MED ORDER — ONDANSETRON HCL 4 MG/2ML IJ SOLN: 4.0000 mg | Freq: Once | INTRAMUSCULAR | Status: DC | PRN

## 2019-11-17 MED ORDER — MIDAZOLAM HCL 2 MG/2ML IJ SOLN
1.0000 mg | INTRAMUSCULAR | Status: DC | PRN
  Administered 2019-11-17: 2 mg via INTRAVENOUS

## 2019-11-17 MED ORDER — LACTATED RINGERS IV SOLN: INTRAVENOUS | Status: DC

## 2019-11-17 MED ORDER — ROPIVACAINE HCL 5 MG/ML IJ SOLN
INTRAMUSCULAR | Status: DC | PRN
Start: 2019-11-17 — End: 2019-11-17
  Administered 2019-11-17: 30 mL via PERINEURAL

## 2019-11-17 MED ORDER — CEFAZOLIN SODIUM-DEXTROSE 2-4 GM/100ML-% IV SOLN
INTRAVENOUS | Status: AC
  Filled 2019-11-17: qty 100

## 2019-11-17 MED ORDER — MEPERIDINE HCL 25 MG/ML IJ SOLN: 6.2500 mg | INTRAMUSCULAR | Status: DC | PRN

## 2019-11-17 MED ORDER — 0.9 % SODIUM CHLORIDE (POUR BTL) OPTIME
TOPICAL | Status: DC | PRN
  Administered 2019-11-17: 15:00:00 1000 mL

## 2019-11-17 MED ORDER — PROPOFOL 10 MG/ML IV BOLUS
INTRAVENOUS | Status: DC | PRN
  Administered 2019-11-17: 50 mg via INTRAVENOUS
  Administered 2019-11-17 (×2): 20 mg via INTRAVENOUS
  Administered 2019-11-17: 30 mg via INTRAVENOUS

## 2019-11-17 MED ORDER — LIDOCAINE HCL (CARDIAC) PF 100 MG/5ML IV SOSY
PREFILLED_SYRINGE | INTRAVENOUS | Status: DC | PRN
  Administered 2019-11-17: 40 mg via INTRAVENOUS

## 2019-11-17 MED ORDER — LIDOCAINE 2% (20 MG/ML) 5 ML SYRINGE
INTRAMUSCULAR | Status: AC
  Filled 2019-11-17: qty 5

## 2019-11-17 MED ORDER — FENTANYL CITRATE (PF) 100 MCG/2ML IJ SOLN: 25.0000 ug | INTRAMUSCULAR | Status: DC | PRN

## 2019-11-17 MED ORDER — HYDROCODONE-ACETAMINOPHEN 5-325 MG PO TABS: 1.0000 | ORAL_TABLET | ORAL | 0 refills | Status: AC | PRN

## 2019-11-17 MED ORDER — FENTANYL CITRATE (PF) 100 MCG/2ML IJ SOLN
50.0000 ug | INTRAMUSCULAR | Status: DC | PRN
  Administered 2019-11-17: 100 ug via INTRAVENOUS

## 2019-11-17 MED ORDER — PROPOFOL 500 MG/50ML IV EMUL
INTRAVENOUS | Status: DC | PRN
  Administered 2019-11-17: 75 ug/kg/min via INTRAVENOUS

## 2019-11-17 MED ORDER — ONDANSETRON HCL 4 MG/2ML IJ SOLN
INTRAMUSCULAR | Status: AC
  Filled 2019-11-17: qty 2

## 2019-11-17 MED ORDER — OXYCODONE HCL 5 MG PO TABS: 5.0000 mg | ORAL_TABLET | Freq: Once | ORAL | Status: DC | PRN

## 2019-11-17 MED ORDER — CEFAZOLIN SODIUM-DEXTROSE 2-4 GM/100ML-% IV SOLN
2.0000 g | INTRAVENOUS | Status: AC
  Administered 2019-11-17: 2 g via INTRAVENOUS

## 2019-11-17 MED ORDER — OXYCODONE HCL 5 MG/5ML PO SOLN: 5.0000 mg | Freq: Once | ORAL | Status: DC | PRN

## 2019-11-17 SURGICAL SUPPLY — 53 items
BLADE SURG 15 STRL LF DISP TIS (BLADE) ×1 IMPLANT
BLADE SURG 15 STRL SS (BLADE) ×3
BNDG CMPR 9X4 STRL LF SNTH (GAUZE/BANDAGES/DRESSINGS) ×1
BNDG COHESIVE 3X5 TAN STRL LF (GAUZE/BANDAGES/DRESSINGS) ×1 IMPLANT
BNDG CONFORM 3 STRL LF (GAUZE/BANDAGES/DRESSINGS) ×3 IMPLANT
BNDG ELASTIC 3X5.8 VLCR STR LF (GAUZE/BANDAGES/DRESSINGS) ×1 IMPLANT
BNDG ESMARK 4X9 LF (GAUZE/BANDAGES/DRESSINGS) ×3 IMPLANT
BNDG GAUZE ELAST 4 BULKY (GAUZE/BANDAGES/DRESSINGS) ×1 IMPLANT
CANISTER SUCT 1200ML W/VALVE (MISCELLANEOUS) ×3 IMPLANT
CORD BIPOLAR FORCEPS 12FT (ELECTRODE) ×3 IMPLANT
COVER BACK TABLE 60X90IN (DRAPES) ×3 IMPLANT
COVER MAYO STAND STRL (DRAPES) ×5 IMPLANT
COVER WAND RF STERILE (DRAPES) ×2 IMPLANT
CUFF TOURN SGL QUICK 18X4 (TOURNIQUET CUFF) ×2 IMPLANT
DECANTER SPIKE VIAL GLASS SM (MISCELLANEOUS) ×1 IMPLANT
DRAIN PENROSE 1/4X12 LTX STRL (WOUND CARE) ×1 IMPLANT
DRAPE EXTREMITY T 121X128X90 (DISPOSABLE) ×3 IMPLANT
DRAPE SURG 17X23 STRL (DRAPES) ×3 IMPLANT
DRSG EMULSION OIL 3X3 NADH (GAUZE/BANDAGES/DRESSINGS) ×3 IMPLANT
GAUZE 4X4 16PLY RFD (DISPOSABLE) ×1 IMPLANT
GLOVE BIO SURGEON STRL SZ8 (GLOVE) ×3 IMPLANT
GLOVE BIOGEL PI IND STRL 7.0 (GLOVE) IMPLANT
GLOVE BIOGEL PI IND STRL 8.5 (GLOVE) ×1 IMPLANT
GLOVE BIOGEL PI INDICATOR 7.0 (GLOVE) ×6
GLOVE BIOGEL PI INDICATOR 8.5 (GLOVE) ×2
GLOVE ECLIPSE 6.5 STRL STRAW (GLOVE) ×2 IMPLANT
GLOVE SURG SS PI 7.0 STRL IVOR (GLOVE) ×2 IMPLANT
GOWN STRL REUS W/ TWL LRG LVL3 (GOWN DISPOSABLE) ×1 IMPLANT
GOWN STRL REUS W/ TWL XL LVL3 (GOWN DISPOSABLE) ×1 IMPLANT
GOWN STRL REUS W/TWL LRG LVL3 (GOWN DISPOSABLE) ×3
GOWN STRL REUS W/TWL XL LVL3 (GOWN DISPOSABLE) ×6
LOOP VESSEL MAXI BLUE (MISCELLANEOUS) ×3 IMPLANT
NDL HYPO 25X1 1.5 SAFETY (NEEDLE) ×1 IMPLANT
NEEDLE HYPO 22GX1.5 SAFETY (NEEDLE) ×1 IMPLANT
NEEDLE HYPO 25X1 1.5 SAFETY (NEEDLE) IMPLANT
PAD ALCOHOL SWAB (MISCELLANEOUS) ×1 IMPLANT
PAD CAST 3X4 CTTN HI CHSV (CAST SUPPLIES) ×1 IMPLANT
PADDING CAST ABS 4INX4YD NS (CAST SUPPLIES)
PADDING CAST ABS COTTON 4X4 ST (CAST SUPPLIES) ×1 IMPLANT
PADDING CAST COTTON 3X4 STRL (CAST SUPPLIES)
SET BASIN DAY SURGERY F.S. (CUSTOM PROCEDURE TRAY) ×3 IMPLANT
SPLINT FIBERGLASS 3X35 (CAST SUPPLIES) ×1 IMPLANT
SPLINT PLASTER CAST XFAST 3X15 (CAST SUPPLIES) ×1 IMPLANT
SPLINT PLASTER XTRA FASTSET 3X (CAST SUPPLIES)
STOCKINETTE 4X48 STRL (DRAPES) ×3 IMPLANT
SUT PROLENE 4 0 PS 2 18 (SUTURE) ×5 IMPLANT
SUT VIC AB 4-0 SH 27 (SUTURE)
SUT VIC AB 4-0 SH 27XANBCTRL (SUTURE) ×1 IMPLANT
SYR BULB EAR ULCER 3OZ GRN STR (SYRINGE) ×3 IMPLANT
SYR CONTROL 10ML LL (SYRINGE) ×1 IMPLANT
TOWEL GREEN STERILE FF (TOWEL DISPOSABLE) ×3 IMPLANT
TRAY DSU PREP LF (CUSTOM PROCEDURE TRAY) ×3 IMPLANT
UNDERPAD 30X36 HEAVY ABSORB (UNDERPADS AND DIAPERS) ×3 IMPLANT

## 2019-11-17 NOTE — Anesthesia Preprocedure Evaluation (Addendum)
Anesthesia Evaluation  Patient identified by MRN, date of birth, ID band Patient awake    Reviewed: Allergy & Precautions, NPO status , Patient's Chart, lab work & pertinent test results  Airway Mallampati: II  TM Distance: >3 FB Neck ROM: Full    Dental no notable dental hx. (+) Teeth Intact   Pulmonary asthma , Current Smoker,    Pulmonary exam normal breath sounds clear to auscultation       Cardiovascular negative cardio ROS Normal cardiovascular exam Rhythm:Regular Rate:Normal     Neuro/Psych negative neurological ROS  negative psych ROS   GI/Hepatic negative GI ROS, Neg liver ROS,   Endo/Other  negative endocrine ROS  Renal/GU negative Renal ROS  negative genitourinary   Musculoskeletal  (+) Arthritis , Osteoarthritis,  Cellulitis left long finger Back pain   Abdominal   Peds  Hematology negative hematology ROS (+)   Anesthesia Other Findings   Reproductive/Obstetrics                             Anesthesia Physical Anesthesia Plan  ASA: II  Anesthesia Plan: MAC   Post-op Pain Management:    Induction: Intravenous  PONV Risk Score and Plan: Midazolam, Ondansetron and Treatment may vary due to age or medical condition  Airway Management Planned: Natural Airway and Nasal Cannula  Additional Equipment:   Intra-op Plan:   Post-operative Plan:   Informed Consent: I have reviewed the patients History and Physical, chart, labs and discussed the procedure including the risks, benefits and alternatives for the proposed anesthesia with the patient or authorized representative who has indicated his/her understanding and acceptance.     Dental advisory given  Plan Discussed with: CRNA and Surgeon  Anesthesia Plan Comments:        Anesthesia Quick Evaluation

## 2019-11-17 NOTE — Anesthesia Postprocedure Evaluation (Signed)
Anesthesia Post Note  Patient: KARSEN FELLOWS  Procedure(s) Performed: INCISION AND DRAINAGE LEFT LONG FINGER (Left Finger)     Patient location during evaluation: PACU Anesthesia Type: MAC Level of consciousness: awake and alert Pain management: pain level controlled Vital Signs Assessment: post-procedure vital signs reviewed and stable Respiratory status: spontaneous breathing, nonlabored ventilation and respiratory function stable Cardiovascular status: blood pressure returned to baseline and stable Postop Assessment: no apparent nausea or vomiting Anesthetic complications: no    Last Vitals:  Vitals:   11/17/19 1645 11/17/19 1700  BP: 124/69 137/85  Pulse: 76 67  Resp: (!) 27 19  Temp:    SpO2: 100% 100%    Last Pain:  Vitals:   11/17/19 1700  TempSrc:   PainSc: 0-No pain                 Lynda Rainwater

## 2019-11-17 NOTE — Discharge Instructions (Signed)
KEEP BANDAGE CLEAN AND DRY CALL OFFICE FOR F/U APPT [phone] KEEP HAND ELEVATED ABOVE HEART OK TO APPLY ICE TO OPERATIVE AREA CONTACT OFFICE IF ANY WORSENING PAIN OR CONCERNS.  Post Anesthesia Home Care Instructions  Activity: Get plenty of rest for the remainder of the day. A responsible individual must stay with you for 24 hours following the procedure.  For the next 24 hours, DO NOT: -Drive a car -Operate machinery -Drink alcoholic beverages -Take any medication unless instructed by your physician -Make any legal decisions or sign important papers.  Meals: Start with liquid foods such as gelatin or soup. Progress to regular foods as tolerated. Avoid greasy, spicy, heavy foods. If nausea and/or vomiting occur, drink only clear liquids until the nausea and/or vomiting subsides. Call your physician if vomiting continues.  Special Instructions/Symptoms: Your throat may feel dry or sore from the anesthesia or the breathing tube placed in your throat during surgery. If this causes discomfort, gargle with warm salt water. The discomfort should disappear within 24 hours.  If you had a scopolamine patch placed behind your ear for the management of post- operative nausea and/or vomiting:  1. The medication in the patch is effective for 72 hours, after which it should be removed.  Wrap patch in a tissue and discard in the trash. Wash hands thoroughly with soap and water. 2. You may remove the patch earlier than 72 hours if you experience unpleasant side effects which may include dry mouth, dizziness or visual disturbances. 3. Avoid touching the patch. Wash your hands with soap and water after contact with the patch.    Regional Anesthesia Blocks  1. Numbness or the inability to move the "blocked" extremity may last from 3-48 hours after placement. The length of time depends on the medication injected and your individual response to the medication. If the numbness is not going away after 48  hours, call your surgeon.  2. The extremity that is blocked will need to be protected until the numbness is gone and the  Strength has returned. Because you cannot feel it, you will need to take extra care to avoid injury. Because it may be weak, you may have difficulty moving it or using it. You may not know what position it is in without looking at it while the block is in effect.  3. For blocks in the legs and feet, returning to weight bearing and walking needs to be done carefully. You will need to wait until the numbness is entirely gone and the strength has returned. You should be able to move your leg and foot normally before you try and bear weight or walk. You will need someone to be with you when you first try to ensure you do not fall and possibly risk injury.  4. Bruising and tenderness at the needle site are common side effects and will resolve in a few days.  5. Persistent numbness or new problems with movement should be communicated to the surgeon or the [facility] ([phone])/ [facility] ([phone]). 

## 2019-11-17 NOTE — Anesthesia Procedure Notes (Signed)
Anesthesia Regional Block: Axillary brachial plexus block   Pre-Anesthetic Checklist: ,, timeout performed, Correct Patient, Correct Site, Correct Laterality, Correct Procedure, Correct Position, site marked, Risks and benefits discussed,  Surgical consent,  Pre-op evaluation,  At surgeon's request and post-op pain management  Laterality: Left  Prep: chloraprep       Needles:  Injection technique: Single-shot  Needle Type: Echogenic Stimulator Needle     Needle Length: 9cm  Needle Gauge: 21   Needle insertion depth: 6 cm   Additional Needles:   Procedures:,,,, ultrasound used (permanent image in chart),,,,  Narrative:  Start time: 11/17/2019 1:23 PM End time: 11/17/2019 1:28 PM Injection made incrementally with aspirations every 5 mL.  Performed by: Personally  Anesthesiologist: Mal Amabile, MD  Additional Notes: Timeout performed. Patient sedated. Relevant anatomy ID'd using [place]. Incremental 2-33ml injection of LA with frequent aspiration. Patient tolerated procedure well.        Left Axillary Block

## 2019-11-17 NOTE — Op Note (Signed)
PREOPERATIVE DIAGNOSIS: Left long finger distal phalanx osteomyelitis  POSTOPERATIVE DIAGNOSIS: Same  ATTENDING SURGEON: Dr. Bradly Bienenstock who scrubbed and present for the entire procedure  ASSISTANT SURGEON: Lambert Mody, [facility] who scrubbed in necessary for exposure irrigation bony debridement closure and splinting in a timely fashion  ANESTHESIA: Regional with IV sedation  OPERATIVE PROCEDURE: #1: Debridement of skin subcutaneous tissue excisional debridement left long finger #2: Bone debridement and curettage left long finger distal phalanx osteomyelitis #3: Left long finger flexor sheath drainage  IMPLANTS: None  RADIOGRAPHIC INTERPRETATION: None  SURGICAL INDICATIONS: Patient is a right-hand-dominant female had a persistent left long finger infection.  Patient had been in the office and followed closely where she had undergone multiple I&D's at the persistent infection is recommended she undergo the above procedure.  Risks of surgery include but not limited to bleeding infection damage nearby nerves arteries or tendons loss of motion of the wrist and digits incomplete relief of symptoms and need for further surgical invention.  SURGICAL TECHNIQUE: Patient is palpated find the preoperative holding area marked for marker made the left long finger and indicate correct operative site.  Patient brought back operating placed supine on anesthesia table where the regional anesthetic was administered.  Patient tolerates well.  Well-padded tourniquet was then placed on the left brachium seal with the appropriate drape.  Left upper extremities were prepped and draped in normal sterile fashion.  The previous incision was extended in the mid axial line proximally.  Dissection carried down through the skin and subcutaneous tissue.  A tourniquet of been insufflated with Esmarch exsanguination was not used.  Excisional debridement of the skin and subcutaneous tissue was then carried out with a sharp rondure  knife and scissors removing the devitalized tissue.  Wound cultures were then taken.  After excisional debridement the patient was left with the exposed distal phalanx.  Debridement and curettage of the distal phalanx and removal of the portion of the distal phalanx was then carried out with this sharp instrumentation, bone cutters.  Bone was then sent for pathology for cultures.  The flexor sheath was then exposed the FDP had been exposed.  Flexor sheath drainage was then done.  The wound was then thoroughly irrigated.  After thorough wound irrigation finger was then closed loosely over small blue vessel loop.  Adaptic dressing a sterile compressive bandage applied.  Tourniquet deflated there is good perfusion of the finger patient tolerated the procedure well.  POSTOPERATIVE PLAN: Patient be discharged to home.  See him back in the office in 2 days for wound check removal of the drain follow the wound cultures and bone cultures.  Continue on the oral antibiotics.  Radiographs of the first visit.  Continue to follow her closely.  She does not respond the patient may require further debridement and/or potential amputation to the level of the distal interphalangeal joint.

## 2019-11-17 NOTE — Transfer of Care (Signed)
Immediate Anesthesia Transfer of Care Note  Patient: Stephanie Richards  Procedure(s) Performed: INCISION AND DRAINAGE LEFT LONG FINGER (Left Finger)  Patient Location: PACU  Anesthesia Type:MAC combined with regional for post-op pain  Level of Consciousness: awake, alert , oriented and patient cooperative  Airway & Oxygen Therapy: Patient Spontanous Breathing and Patient connected to face mask oxygen  Post-op Assessment: Report given to RN and Post -op Vital signs reviewed and stable  Post vital signs: Reviewed and stable  Last Vitals:  Vitals Value Taken Time  BP 126/67 11/17/19 1635  Temp    Pulse 57 11/17/19 1636  Resp 15 11/17/19 1636  SpO2 100 % 11/17/19 1636  Vitals shown include unvalidated device data.  Last Pain:  Vitals:   11/17/19 1329  TempSrc:   PainSc: 0-No pain      Patients Stated Pain Goal: 3 (12/21/54 1537)  Complications: No apparent anesthesia complications

## 2019-11-17 NOTE — Progress Notes (Signed)
Assisted Dr. Foster with left, ultrasound guided, axillary block. Side rails up, monitors on throughout procedure. See vital signs in flow sheet. Tolerated Procedure well. 

## 2019-11-17 NOTE — H&P (Signed)
Stephanie Richards is an 44 y.o. female.   Chief Complaint: LEFT LONG FINGER PAIN  HPI: The patient is a 44 y/o left hand dominant female who noticed swelling and drainage of the left long finger after a fall at the beginning of April. She was initially treated with Doxycycline but she is unable to swallow pills, therefore she was unable to take the medication. She was seen in our office since she was not seeing any improvement in the finger. An incision and drainage was done in our office on 11/04/19 and 11/09/19 under sterile conditions. She has been taking Doxycycline tablets and doing twice daily betadine soaks. She continues to have drainage, pain, swelling, tingling, and limited motion of the finger. Discussed the reason and rationale for surgical intervention.  She is here today for surgery.  She denies fever, chills, nausea, vomiting, diarrhea, shortness of breath, or chest pain.     Past Medical History:  Diagnosis Date  . Arthritis   . Asthma     No past surgical history on file.  Family History  Problem Relation Age of Onset  . Healthy Mother   . Healthy Father    Social History:  reports that she has been smoking cigarettes. She has been smoking about 0.50 packs per day. She has never used smokeless tobacco. She reports current alcohol use. She reports that she does not use drugs.  Allergies: No Known Allergies  No medications prior to admission.    No results found for this or any previous visit (from the past 48 hour(s)). No results found.  ROS NO RECENT ILLNESSES OR HOSPITALIZATIONS  There were no vitals taken for this visit. Physical Exam  General Appearance:  Alert, cooperative, no distress, appears stated age  Head:  Normocephalic, without obvious abnormality, atraumatic  Eyes:  Pupils equal, conjunctiva/corneas clear,         Throat: Lips, mucosa, and tongue normal; teeth and gums normal  Neck: No visible masses     Lungs:   respirations unlabored  Chest  Wall:  No tenderness or deformity  Heart:  Regular rate and rhythm,  Abdomen:   Soft, non-tender,         Extremities: LUE - SWELLING, ERYTHEMA AND PURULENT FOUL SMELLING DRAINAGE FROM THE LONG FINGER. SENSATION INTACT T LIGHT TOUCH DISTALLY. CAPILLARY REFILL LESS THAN 2 SECONDS. FLUCTUANCE OF THE DISTAL PHALANX OF THE LONG FINGER. LIMITED FLEXION OF THE LONG FINGER BUT ABLE TO FULLY EXTEND. SIGNIFICANTLY TENDER TO PALPATION OF THE DISTAL PHALANX OF THE LONG FINGER. ALL OTHER DIGITS ARE UNREMARKABLE.  Pulses: 2+ and symmetric  Skin: Skin color, texture, turgor normal, no rashes or lesions     Neurologic: Normal     Assessment LEFT LONG FINGER CELLULITIS/PARONYCHIA, FLEXOR SHEATH INFECTION   Plan LEFT LONG FINGER INCISION AND DRAINAGE   WE ARE PLANNING SURGERY FOR YOUR UPPER EXTREMITY. THE RISKS AND BENEFITS OF SURGERY INCLUDE BUT NOT LIMITED TO BLEEDING INFECTION, DAMAGE TO NEARBY NERVES ARTERIES TENDONS, FAILURE OF SURGERY TO ACCOMPLISH ITS INTENDED GOALS, PERSISTENT SYMPTOMS AND NEED FOR FURTHER SURGICAL INTERVENTION. WITH THIS IN MIND WE WILL PROCEED. I HAVE DISCUSSED WITH THE PATIENT THE PRE AND POSTOPERATIVE REGIMEN AND THE DOS AND DON'TS. PT VOICED UNDERSTANDING AND INFORMED CONSENT SIGNED.  WE ARE PLANNING SURGERY FOR YOUR UPPER EXTREMITY. THE RISKS AND BENEFITS OF SURGERY INCLUDE BUT NOT LIMITED TO BLEEDING INFECTION, DAMAGE TO NEARBY NERVES ARTERIES TENDONS, FAILURE OF SURGERY TO ACCOMPLISH ITS INTENDED GOALS, PERSISTENT SYMPTOMS AND NEED FOR FURTHER  SURGICAL INTERVENTION. WITH THIS IN MIND WE WILL PROCEED. I HAVE DISCUSSED WITH THE PATIENT THE PRE AND POSTOPERATIVE REGIMEN AND THE DOS AND DON'TS. PT VOICED UNDERSTANDING AND INFORMED CONSENT SIGNED.  Chia Mowers [facility] 11/17/19      Brynda Peon 11/17/2019, 10:41 AM

## 2019-11-18 ENCOUNTER — Encounter: Payer: Self-pay | Admitting: *Deleted

## 2019-11-23 LAB — AEROBIC/ANAEROBIC CULTURE W GRAM STAIN (SURGICAL/DEEP WOUND): Gram Stain: NONE SEEN

## 2019-12-09 DIAGNOSIS — L03012 Cellulitis of left finger: Secondary | ICD-10-CM | POA: Diagnosis not present

## 2019-12-09 DIAGNOSIS — Z4789 Encounter for other orthopedic aftercare: Secondary | ICD-10-CM | POA: Diagnosis not present

## 2020-02-10 DIAGNOSIS — L03012 Cellulitis of left finger: Secondary | ICD-10-CM | POA: Diagnosis not present

## 2020-02-10 DIAGNOSIS — Z4789 Encounter for other orthopedic aftercare: Secondary | ICD-10-CM | POA: Diagnosis not present

## 2020-11-03 ENCOUNTER — Other Ambulatory Visit: Payer: Self-pay

## 2020-11-03 ENCOUNTER — Ambulatory Visit (HOSPITAL_COMMUNITY)
Admission: EM | Admit: 2020-11-03 | Discharge: 2020-11-03 | Disposition: A | Discharge status: Home / Self Care | Payer: Medicaid Other | Attending: Medical Oncology | Admitting: Medical Oncology

## 2020-11-03 ENCOUNTER — Encounter (HOSPITAL_COMMUNITY): Payer: Self-pay

## 2020-11-03 DIAGNOSIS — B86 Scabies: Secondary | ICD-10-CM

## 2020-11-03 MED ORDER — PERMETHRIN 5 % EX CREA
TOPICAL_CREAM | CUTANEOUS | 0 refills | Status: DC
Start: 2020-11-03 — End: 2021-02-28

## 2020-11-03 MED ORDER — IVERMECTIN 3 MG PO TABS: 200.0000 ug/kg | ORAL_TABLET | Freq: Once | ORAL | 0 refills | Status: DC

## 2020-11-03 MED ORDER — IVERMECTIN 3 MG PO TABS: 200.0000 ug/kg | ORAL_TABLET | Freq: Once | ORAL | 0 refills | Status: AC

## 2020-11-03 NOTE — ED Provider Notes (Signed)
[facility]    CSN: [id number] Arrival date & time: 11/03/20  3382      History   Chief Complaint Chief Complaint  Patient presents with  . Rash    HPI Stephanie Richards is a 45 y.o. female.   HPI   Rash: Patient states that for the past few weeks she has had a very itchy rash.  The rash is located on her abdomen, torso, armpit area, legs, hands.  Rash is extremely itchy and worsened at night.  She has not tried anything for symptoms.  No known sick contacts.  She denies any shortness of breath, wheezing, trouble breathing, new products, new medications, stopping any medications/substances.   Past Medical History:  Diagnosis Date  . Arthritis   . Asthma     There are no problems to display for this patient.   Past Surgical History:  Procedure Laterality Date  . CESAREAN SECTION  2010, 2000  . INCISION AND DRAINAGE Left 11/17/2019   Procedure: INCISION AND DRAINAGE LEFT LONG FINGER;  Surgeon: Bradly Bienenstock, MD;  Location: [facility];  Service: Orthopedics;  Laterality: Left;    OB History   No obstetric history on file.      Home Medications    Prior to Admission medications   Medication Sig Start Date End Date Taking? Authorizing Provider  permethrin (ELIMITE) 5 % cream Apply all over the body from the neck to the soles of the feet including the hands and feet. Wash off after 8-14 hours then reapply in 1 week. 11/03/20  Yes Lilibeth Opie M, PA-C  albuterol (VENTOLIN HFA) 108 (90 Base) MCG/ACT inhaler Inhale 1-2 puffs into the lungs every 6 (six) hours as needed for wheezing or shortness of breath. 09/08/19   Mardella Layman, MD  cyclobenzaprine (FLEXERIL) 5 MG tablet Take by mouth. 10/05/18   [provider]  ibuprofen (ADVIL) 600 MG tablet Take 1 tablet (600 mg total) by mouth every 6 (six) hours as needed. 11/03/19   Domenick Gong, MD  tiZANidine (ZANAFLEX) 4 MG tablet Take 1-2 tablets (4-8 mg total) by mouth every 6 (six)  hours as needed for muscle spasms. 10/28/19   Eustace Moore, MD    Family History Family History  Problem Relation Age of Onset  . Healthy Mother   . Healthy Father     Social History Social History   Tobacco Use  . Smoking status: Current Every Day Smoker    Packs/day: 0.50    Types: Cigarettes  . Smokeless tobacco: Never Used  Vaping Use  . Vaping Use: Never used  Substance Use Topics  . Alcohol use: Yes    Alcohol/week: 14.0 standard drinks    Types: 14 Cans of beer per week  . Drug use: No     Allergies   Patient has no known allergies.   Review of Systems Review of Systems  As stated above in HPI Physical Exam Triage Vital Signs ED Triage Vitals [11/03/20 1052]  Enc Vitals Group     BP 120/80     Pulse Rate 72     Resp 20     Temp 98.9 F (37.2 C)     Temp Source Oral     SpO2 99 %     Weight      Height      Head Circumference      Peak Flow      Pain Score 0     Pain Loc  Pain Edu?      Excl. in GC?    No data found.  Updated Vital Signs BP 120/80 (BP Location: Right Arm)   Pulse 72   Temp 98.9 F (37.2 C) (Oral)   Resp 20   LMP 10/21/2020   SpO2 99%   Physical Exam Vitals and nursing note reviewed.  Constitutional:      General: She is not in acute distress.    Appearance: Normal appearance. She is not ill-appearing, toxic-appearing or diaphoretic.  Skin:    Comments: Scattered slightly raised skin colored small papules of the body including burrows of the webbing of hands. Excoriations throughout body from scratching without evidence of bleeding, discharge, excessive erythema.   Neurological:     Mental Status: She is alert.      UC Treatments / Results  Labs (all labs ordered are listed, but only abnormal results are displayed) Labs Reviewed - No data to display  EKG   Radiology No results found.  Procedures Procedures (including critical care time)  Medications Ordered in UC Medications - No data to  display  Initial Impression / Assessment and Plan / UC Course  I have reviewed the triage vital signs and the nursing notes.  Pertinent labs & imaging results that were available during my care of the patient were reviewed by me and considered in my medical decision making (see chart for details).     New.  Discussed with patient that this appears to be scabies.  Discussed that scabies is a mite that burrows under the skin and causes extreme itching.  Treated with topical and oral medication.  Scabies handout given to patient for her to review more closely at home. Discussed red flag signs and symptom.  Final Clinical Impressions(s) / UC Diagnoses   Final diagnoses:  Scabies   Discharge Instructions   None    ED Prescriptions    Medication Sig Dispense Auth. Provider   permethrin (ELIMITE) 5 % cream Apply all over the body from the neck to the soles of the feet including the hands and feet. Wash off after 8-14 hours then reapply in 1 week. 60 g Eun Vermeer M, [place]     PDMP not reviewed this encounter.   Rushie Chestnut, [place] 11/03/20 1127

## 2020-11-03 NOTE — ED Triage Notes (Signed)
Pt presents with recurrent itchy rash on her abdomen, torso and between her legs that is only temporarily relieved with OTC medication.

## 2020-11-09 ENCOUNTER — Telehealth (HOSPITAL_COMMUNITY): Payer: Self-pay

## 2020-11-09 ENCOUNTER — Other Ambulatory Visit: Payer: Self-pay

## 2020-11-09 ENCOUNTER — Ambulatory Visit (HOSPITAL_COMMUNITY)
Admission: EM | Admit: 2020-11-09 | Discharge: 2020-11-09 | Disposition: A | Discharge status: Home / Self Care | Payer: Medicaid Other | Attending: Emergency Medicine | Admitting: Emergency Medicine

## 2020-11-09 ENCOUNTER — Encounter (HOSPITAL_COMMUNITY): Payer: Self-pay

## 2020-11-09 DIAGNOSIS — L299 Pruritus, unspecified: Secondary | ICD-10-CM

## 2020-11-09 DIAGNOSIS — B86 Scabies: Secondary | ICD-10-CM | POA: Diagnosis not present

## 2020-11-09 MED ORDER — HYDROXYZINE HCL 25 MG PO TABS: 25.0000 mg | ORAL_TABLET | Freq: Four times a day (QID) | ORAL | 0 refills | Status: AC | PRN

## 2020-11-09 MED ORDER — HYDROXYZINE HCL 25 MG PO TABS
25.0000 mg | ORAL_TABLET | Freq: Four times a day (QID) | ORAL | 0 refills | Status: DC | PRN
Start: 2020-11-09 — End: 2020-11-09

## 2020-11-09 NOTE — ED Triage Notes (Signed)
Patient presents to Urgent Care for a follow-up visit from 04/15. She states she was seen for a rash and treated with a cream and a antibiotic. She states she was unable to fill her antibiotic due to [facility] not paying for medication. She reports the rash was worsened.   Denies fever.

## 2020-11-09 NOTE — Discharge Instructions (Signed)
You can use the Hydroxyzine every 6 hours as needed for itching.   Continue to use the Elimite cream as previously prescribed.    Return or go to the Emergency Department if symptoms worsen or do not improve in the next few days.

## 2020-11-09 NOTE — ED Provider Notes (Signed)
[facility]    CSN: [id number] Arrival date & time: 11/09/20  1318      History   Chief Complaint Chief Complaint  Patient presents with  . Rash  . Follow-up    HPI RAI Richards is a 45 y.o. Richards.   Patient here for reevaluation of itching and rash.  Patient was seen on 415 diagnosed with scabies.  Reports being prescribed a cream and a pill but was unable to get the pill due to insurance issues.  Reports using the cream as prescribed and states that she believes the rash is getting better but that the itching has gotten worse. Denies any specific alleviating or aggravating factors.  Denies any fevers, chest pain, shortness of breath, N/V/D, numbness, tingling, weakness, abdominal pain, or headaches.   ROS: As per HPI, all other pertinent ROS negative   The history is provided by the patient.  Rash   Past Medical History:  Diagnosis Date  . Arthritis   . Asthma     There are no problems to display for this patient.   Past Surgical History:  Procedure Laterality Date  . CESAREAN SECTION  2010, 2000  . INCISION AND DRAINAGE Left 11/17/2019   Procedure: INCISION AND DRAINAGE LEFT LONG FINGER;  Surgeon: Bradly Bienenstock, MD;  Location: [facility];  Service: Orthopedics;  Laterality: Left;    OB History   No obstetric history on file.      Home Medications    Prior to Admission medications   Medication Sig Start Date End Date Taking? Authorizing Provider  albuterol (VENTOLIN HFA) 108 (90 Base) MCG/ACT inhaler Inhale 1-2 puffs into the lungs every 6 (six) hours as needed for wheezing or shortness of breath. 09/08/19   Mardella Layman, MD  cyclobenzaprine (FLEXERIL) 5 MG tablet Take by mouth. 10/05/18   [provider]  hydrOXYzine (ATARAX/VISTARIL) 25 MG tablet Take 1 tablet (25 mg total) by mouth every 6 (six) hours as needed for up to 7 days for itching. 11/09/20 11/16/20  Ivette Loyal, NP  ibuprofen (ADVIL) 600 MG tablet Take 1  tablet (600 mg total) by mouth every 6 (six) hours as needed. 11/03/19   Domenick Gong, MD  permethrin (ELIMITE) 5 % cream Apply all over the body from the neck to the soles of the feet including the hands and feet. Wash off after 8-14 hours then reapply in 1 week. 11/03/20   Rushie Chestnut, PA-C  tiZANidine (ZANAFLEX) 4 MG tablet Take 1-2 tablets (4-8 mg total) by mouth every 6 (six) hours as needed for muscle spasms. 10/28/19   Eustace Moore, MD    Family History Family History  Problem Relation Age of Onset  . Healthy Mother   . Healthy Father     Social History Social History   Tobacco Use  . Smoking status: Current Every Day Smoker    Packs/day: 0.50    Types: Cigarettes  . Smokeless tobacco: Never Used  Vaping Use  . Vaping Use: Never used  Substance Use Topics  . Alcohol use: Yes    Alcohol/week: 14.0 standard drinks    Types: 14 Cans of beer per week  . Drug use: No     Allergies   Patient has no known allergies.   Review of Systems Review of Systems  Skin: Positive for rash.  All other systems reviewed and are negative.    Physical Exam Triage Vital Signs ED Triage Vitals  Enc Vitals Group  BP 11/09/20 1345 104/68     Pulse Rate 11/09/20 1345 74     Resp 11/09/20 1345 16     Temp 11/09/20 1345 98.4 F (36.9 C)     Temp src --      SpO2 11/09/20 1345 100 %     Weight --      Height --      Head Circumference --      Peak Flow --      Pain Score 11/09/20 1344 8     Pain Loc --      Pain Edu? --      Excl. in GC? --    No data found.  Updated Vital Signs BP 104/68 (BP Location: Left Arm)   Pulse 74   Temp 98.4 F (36.9 C)   Resp 16   LMP 10/21/2020   SpO2 100%   Visual Acuity Right Eye Distance:   Left Eye Distance:   Bilateral Distance:    Right Eye Near:   Left Eye Near:    Bilateral Near:     Physical Exam Vitals and nursing note reviewed.  Constitutional:      General: She is not in acute distress.     Appearance: Normal appearance. She is not ill-appearing, toxic-appearing or diaphoretic.  HENT:     Head: Normocephalic and atraumatic.  Eyes:     Conjunctiva/sclera: Conjunctivae normal.  Cardiovascular:     Rate and Rhythm: Normal rate.     Pulses: Normal pulses.  Pulmonary:     Effort: Pulmonary effort is normal.  Abdominal:     General: Abdomen is flat.  Musculoskeletal:        General: Normal range of motion.     Cervical back: Normal range of motion.  Skin:    General: Skin is warm and dry.     Findings: Rash (scatter papular rash throughout body, pt reports improved from previous visit) present. Rash is papular.  Neurological:     General: No focal deficit present.     Mental Status: She is alert and oriented to person, place, and time.  Psychiatric:        Mood and Affect: Mood normal.      UC Treatments / Results  Labs (all labs ordered are listed, but only abnormal results are displayed) Labs Reviewed - No data to display  EKG   Radiology No results found.  Procedures Procedures (including critical care time)  Medications Ordered in UC Medications - No data to display  Initial Impression / Assessment and Plan / UC Course  I have reviewed the triage vital signs and the nursing notes.  Pertinent labs & imaging results that were available during my care of the patient were reviewed by me and considered in my medical decision making (see chart for details).     Scabies, itching Assessment negative for red flags or concerns.  Continue to use permethrin cream as previously instructed and prescribed.  Hydroxyzine every 6 hours as needed prescribed for significant itching. Follow up with as needed for worsening symptoms.  Final Clinical Impressions(s) / UC Diagnoses   Final diagnoses:  Scabies  Itching     Discharge Instructions     You can use the Hydroxyzine every 6 hours as needed for itching.   Continue to use the Elimite cream as previously  prescribed.    Return or go to the Emergency Department if symptoms worsen or do not improve in the next few days.  ED Prescriptions    Medication Sig Dispense Auth. Provider   hydrOXYzine (ATARAX/VISTARIL) 25 MG tablet Take 1 tablet (25 mg total) by mouth every 6 (six) hours as needed for up to 7 days for itching. 28 tablet Ivette Loyal, NP     PDMP not reviewed this encounter.   Ivette Loyal, NP 11/09/20 [phone]

## 2020-12-01 ENCOUNTER — Encounter (HOSPITAL_COMMUNITY): Payer: Self-pay

## 2020-12-01 ENCOUNTER — Ambulatory Visit (HOSPITAL_COMMUNITY)
Admission: EM | Admit: 2020-12-01 | Discharge: 2020-12-01 | Disposition: A | Discharge status: Home / Self Care | Payer: Medicaid Other | Attending: Family Medicine | Admitting: Family Medicine

## 2020-12-01 DIAGNOSIS — G8929 Other chronic pain: Secondary | ICD-10-CM

## 2020-12-01 DIAGNOSIS — M545 Low back pain, unspecified: Secondary | ICD-10-CM | POA: Diagnosis not present

## 2020-12-01 DIAGNOSIS — R21 Rash and other nonspecific skin eruption: Secondary | ICD-10-CM

## 2020-12-01 MED ORDER — CETIRIZINE HCL 10 MG PO TABS
10.0000 mg | ORAL_TABLET | Freq: Every day | ORAL | 0 refills | Status: DC
Start: 2020-12-01 — End: 2021-02-28

## 2020-12-01 MED ORDER — PREDNISONE 20 MG PO TABS
40.0000 mg | ORAL_TABLET | Freq: Every day | ORAL | 0 refills | Status: DC
Start: 2020-12-01 — End: 2021-02-28

## 2020-12-01 MED ORDER — TRIAMCINOLONE ACETONIDE 40 MG/ML IJ SUSP
40.0000 mg | Freq: Once | INTRAMUSCULAR | Status: AC
  Administered 2020-12-01: 40 mg via INTRAMUSCULAR

## 2020-12-01 MED ORDER — TRIAMCINOLONE ACETONIDE 40 MG/ML IJ SUSP
INTRAMUSCULAR | Status: AC
  Filled 2020-12-01: qty 1

## 2020-12-01 MED ORDER — NYSTATIN 100000 UNIT/GM EX CREA
TOPICAL_CREAM | CUTANEOUS | 0 refills | Status: DC
Start: 2020-12-01 — End: 2021-01-08

## 2020-12-01 NOTE — ED Triage Notes (Signed)
Pt in with c/o rash and skin irritation that has been going on over 1 weeks  Pt states she was seen and has been using ointment prescribed with no relief

## 2020-12-01 NOTE — ED Provider Notes (Signed)
[facility]    CSN: [id number] Arrival date & time: 12/01/20  1735      History   Chief Complaint Chief Complaint  Patient presents with  . skin irritation    HPI Stephanie Richards is a 45 y.o. female.   Patient presenting today with severely pruritic rash that is mostly in bilateral groin and pubic area but also on upper legs and shoulders x1 month.  She states she was diagnosed with scabies 1 month ago and has been treated x2 now with permethrin cream with no benefit.  Denies any new exposures, recent travel, new medications.  Known chronic dermatologic issues that she is aware of.  Has not been trying anything over-the-counter for symptoms.  Denies fever, chills, body aches, insect bites, headaches, drainage from the areas.  Also requesting a note for work stating that she has chronic back issues and limitations since we have seen her for her back issues before.     Past Medical History:  Diagnosis Date  . Arthritis   . Asthma     There are no problems to display for this patient.   Past Surgical History:  Procedure Laterality Date  . CESAREAN SECTION  2010, 2000  . INCISION AND DRAINAGE Left 11/17/2019   Procedure: INCISION AND DRAINAGE LEFT LONG FINGER;  Surgeon: Bradly Bienenstock, MD;  Location: [facility];  Service: Orthopedics;  Laterality: Left;    OB History   No obstetric history on file.      Home Medications    Prior to Admission medications   Medication Sig Start Date End Date Taking? Authorizing Provider  cetirizine (ZYRTEC ALLERGY) 10 MG tablet Take 1 tablet (10 mg total) by mouth daily. 12/01/20  Yes Particia Nearing, PA-C  nystatin cream (MYCOSTATIN) Apply to affected area 2 times daily 12/01/20  Yes Particia Nearing, PA-C  predniSONE (DELTASONE) 20 MG tablet Take 2 tablets (40 mg total) by mouth daily with breakfast. 12/01/20  Yes Particia Nearing, PA-C  albuterol (VENTOLIN HFA) 108 (90 Base) MCG/ACT inhaler  Inhale 1-2 puffs into the lungs every 6 (six) hours as needed for wheezing or shortness of breath. 09/08/19   Mardella Layman, MD  cyclobenzaprine (FLEXERIL) 5 MG tablet Take by mouth. 10/05/18   [provider]  ibuprofen (ADVIL) 600 MG tablet Take 1 tablet (600 mg total) by mouth every 6 (six) hours as needed. 11/03/19   Domenick Gong, MD  permethrin (ELIMITE) 5 % cream Apply all over the body from the neck to the soles of the feet including the hands and feet. Wash off after 8-14 hours then reapply in 1 week. 11/03/20   Rushie Chestnut, PA-C  tiZANidine (ZANAFLEX) 4 MG tablet Take 1-2 tablets (4-8 mg total) by mouth every 6 (six) hours as needed for muscle spasms. 10/28/19   Eustace Moore, MD    Family History Family History  Problem Relation Age of Onset  . Healthy Mother   . Healthy Father     Social History Social History   Tobacco Use  . Smoking status: Current Every Day Smoker    Packs/day: 0.50    Types: Cigarettes  . Smokeless tobacco: Never Used  Vaping Use  . Vaping Use: Never used  Substance Use Topics  . Alcohol use: Yes    Alcohol/week: 14.0 standard drinks    Types: 14 Cans of beer per week  . Drug use: No     Allergies   Patient has no  known allergies.   Review of Systems Review of Systems Per HPI  Physical Exam Triage Vital Signs ED Triage Vitals  Enc Vitals Group     BP 12/01/20 1758 109/63     Pulse Rate 12/01/20 1758 81     Resp 12/01/20 1758 18     Temp 12/01/20 1757 98.3 F (36.8 C)     Temp src --      SpO2 12/01/20 1758 100 %     Weight --      Height --      Head Circumference --      Peak Flow --      Pain Score 12/01/20 1756 10     Pain Loc --      Pain Edu? --      Excl. in GC? --    No data found.  Updated Vital Signs BP 109/63   Pulse 81   Temp 98.3 F (36.8 C)   Resp 18   LMP 11/08/2020 (Approximate)   SpO2 100%   Visual Acuity Right Eye Distance:   Left Eye Distance:   Bilateral Distance:     Right Eye Near:   Left Eye Near:    Bilateral Near:     Physical Exam Vitals and nursing note reviewed.  Constitutional:      Appearance: Normal appearance. She is not ill-appearing.  HENT:     Head: Atraumatic.  Eyes:     Extraocular Movements: Extraocular movements intact.     Conjunctiva/sclera: Conjunctivae normal.  Cardiovascular:     Rate and Rhythm: Normal rate and regular rhythm.     Heart sounds: Normal heart sounds.  Pulmonary:     Effort: Pulmonary effort is normal.     Breath sounds: Normal breath sounds.  Musculoskeletal:        General: Normal range of motion.     Cervical back: Normal range of motion and neck supple.  Skin:    General: Skin is warm and dry.     Comments: No appreciable rash bilateral upper legs, though streaks of erythematous scratching marks present along thighs and have observed her actively scratching the areas anxiously.  Areas bilateral groin folds and over the mons pubis where she states the itching is the worst, no appreciable rash noted  Neurological:     Mental Status: She is alert and oriented to person, place, and time.  Psychiatric:        Thought Content: Thought content normal.        Judgment: Judgment normal.     Comments: Tearful, appears frustrated with condition      UC Treatments / Results  Labs (all labs ordered are listed, but only abnormal results are displayed) Labs Reviewed - No data to display  EKG   Radiology No results found.  Procedures Procedures (including critical care time)  Medications Ordered in UC Medications  triamcinolone acetonide (KENALOG-40) injection 40 mg (40 mg Intramuscular Given 12/01/20 1926)    Initial Impression / Assessment and Plan / UC Course  I have reviewed the triage vital signs and the nursing notes.  Pertinent labs & imaging results that were available during my care of the patient were reviewed by me and considered in my medical decision making (see chart for  details).     Unclear etiology of her rash today, no benefit with scabies treatment x2.  She is requesting a topical for the groin area, will give nystatin cream in case yeast component though no appreciable  evidence of this.  Discussed trial of prednisone in case inflammatory/allergic she is also requesting a shot while she is here for immediate fact that she can get to the pharmacy right away so we will give IM Kenalog.  Trial Zyrtec daily as well in case allergic.  Dermatology information given if not resolving.  Also discussed regarding her chronic back pain that she would need to see sports med or occupational health for work restrictions and ongoing management.  This information was given to her.  Final Clinical Impressions(s) / UC Diagnoses   Final diagnoses:  Rash and nonspecific skin eruption  Chronic low back pain, unspecified back pain laterality, unspecified whether sciatica present   Discharge Instructions   None    ED Prescriptions    Medication Sig Dispense Auth. Provider   nystatin cream (MYCOSTATIN) Apply to affected area 2 times daily 90 g Particia Nearing, PA-C   predniSONE (DELTASONE) 20 MG tablet Take 2 tablets (40 mg total) by mouth daily with breakfast. 10 tablet Particia Nearing, PA-C   cetirizine (ZYRTEC ALLERGY) 10 MG tablet Take 1 tablet (10 mg total) by mouth daily. 30 tablet Particia Nearing, [place]     PDMP not reviewed this encounter.   Roosvelt Maser [place], [place] 12/02/20 [phone]

## 2021-01-08 ENCOUNTER — Other Ambulatory Visit: Payer: Self-pay

## 2021-01-08 ENCOUNTER — Ambulatory Visit (HOSPITAL_COMMUNITY)
Admission: EM | Admit: 2021-01-08 | Discharge: 2021-01-08 | Disposition: A | Discharge status: Home / Self Care | Payer: Medicaid Other | Attending: Emergency Medicine | Admitting: Emergency Medicine

## 2021-01-08 ENCOUNTER — Encounter (HOSPITAL_COMMUNITY): Payer: Self-pay | Admitting: Emergency Medicine

## 2021-01-08 DIAGNOSIS — S39012A Strain of muscle, fascia and tendon of lower back, initial encounter: Secondary | ICD-10-CM | POA: Diagnosis not present

## 2021-01-08 DIAGNOSIS — M62830 Muscle spasm of back: Secondary | ICD-10-CM | POA: Diagnosis not present

## 2021-01-08 DIAGNOSIS — N761 Subacute and chronic vaginitis: Secondary | ICD-10-CM | POA: Diagnosis not present

## 2021-01-08 MED ORDER — KETOROLAC TROMETHAMINE 30 MG/ML IJ SOLN
30.0000 mg | Freq: Once | INTRAMUSCULAR | Status: AC
  Administered 2021-01-08: 30 mg via INTRAMUSCULAR

## 2021-01-08 MED ORDER — DEXAMETHASONE SODIUM PHOSPHATE 10 MG/ML IJ SOLN
INTRAMUSCULAR | Status: AC
  Filled 2021-01-08: qty 1

## 2021-01-08 MED ORDER — NYSTATIN 100000 UNIT/GM EX CREA
TOPICAL_CREAM | CUTANEOUS | 0 refills | Status: DC
Start: 2021-01-08 — End: 2021-02-28

## 2021-01-08 MED ORDER — IBUPROFEN 600 MG PO TABS
600.0000 mg | ORAL_TABLET | Freq: Four times a day (QID) | ORAL | 0 refills | Status: DC | PRN
Start: 2021-01-08 — End: 2023-06-30

## 2021-01-08 MED ORDER — TIZANIDINE HCL 4 MG PO TABS
4.0000 mg | ORAL_TABLET | Freq: Four times a day (QID) | ORAL | 0 refills | Status: DC | PRN
Start: 2021-01-08 — End: 2021-07-22

## 2021-01-08 MED ORDER — DEXAMETHASONE SODIUM PHOSPHATE 10 MG/ML IJ SOLN
10.0000 mg | Freq: Once | INTRAMUSCULAR | Status: AC
  Administered 2021-01-08: 10 mg via INTRAMUSCULAR

## 2021-01-08 MED ORDER — KETOROLAC TROMETHAMINE 30 MG/ML IJ SOLN
INTRAMUSCULAR | Status: AC
  Filled 2021-01-08: qty 1

## 2021-01-08 NOTE — ED Triage Notes (Signed)
Complains of recurrent lower back pain.  Pain is mid to lower, particularly on right.  No radiation into either leg.  No known injury. History of the same

## 2021-01-08 NOTE — ED Provider Notes (Signed)
[facility]    CSN: [id number] Arrival date & time: 01/08/21  0805      History   Chief Complaint Chief Complaint  Patient presents with   Back Pain    HPI Stephanie Richards is a 45 y.o. female.   Patient here for evaluation lower right sided back pain has been ongoing for the past 4 days.  Reports having similar pain with muscle spasms in the past that was treated with an injection and then NSAID and muscle relaxer use.  Reports that it has been awhile long time since she has had symptoms return.  Denies any left-sided or midline back pain.  Denies any loss of bowel or bladder control.  Denies any numbness or radiation to her lower extremities. Also reports having some vaginal itching and irritation for the past several days.  Reports similar symptoms that were relieved with a cream in the past.  States that she has run out of cream.   Denies any trauma, injury, or other precipitating event.  Denies any specific alleviating or aggravating factors.  Denies any fevers, chest pain, shortness of breath, N/V/D, numbness, tingling, weakness, abdominal pain, or headaches.     The history is provided by the patient.  Back Pain  Past Medical History:  Diagnosis Date   Arthritis    Asthma     There are no problems to display for this patient.   Past Surgical History:  Procedure Laterality Date   CESAREAN SECTION  2010, 2000   INCISION AND DRAINAGE Left 11/17/2019   Procedure: INCISION AND DRAINAGE LEFT LONG FINGER;  Surgeon: Bradly Bienenstock, MD;  Location: [facility];  Service: Orthopedics;  Laterality: Left;    OB History   No obstetric history on file.      Home Medications    Prior to Admission medications   Medication Sig Start Date End Date Taking? Authorizing Provider  ibuprofen (ADVIL) 600 MG tablet Take 1 tablet (600 mg total) by mouth every 6 (six) hours as needed. 01/08/21  Yes Ivette Loyal, NP  nystatin cream (MYCOSTATIN) Apply to  affected area 2 times daily 01/08/21  Yes Ivette Loyal, NP  tiZANidine (ZANAFLEX) 4 MG tablet Take 1 tablet (4 mg total) by mouth every 6 (six) hours as needed for muscle spasms. 01/08/21  Yes Ivette Loyal, NP  albuterol (VENTOLIN HFA) 108 (90 Base) MCG/ACT inhaler Inhale 1-2 puffs into the lungs every 6 (six) hours as needed for wheezing or shortness of breath. 09/08/19   Mardella Layman, MD  cetirizine (ZYRTEC ALLERGY) 10 MG tablet Take 1 tablet (10 mg total) by mouth daily. Patient not taking: Reported on 01/08/2021 12/01/20   Particia Nearing, PA-C  permethrin (ELIMITE) 5 % cream Apply all over the body from the neck to the soles of the feet including the hands and feet. Wash off after 8-14 hours then reapply in 1 week. Patient not taking: Reported on 01/08/2021 11/03/20   Rushie Chestnut, PA-C  predniSONE (DELTASONE) 20 MG tablet Take 2 tablets (40 mg total) by mouth daily with breakfast. Patient not taking: Reported on 01/08/2021 12/01/20   Particia Nearing, PA-C    Family History Family History  Problem Relation Age of Onset   Healthy Mother    Healthy Father     Social History Social History   Tobacco Use   Smoking status: Every Day    Packs/day: 0.50    Pack years: 0.00    Types:  Cigarettes   Smokeless tobacco: Never  Vaping Use   Vaping Use: Never used  Substance Use Topics   Alcohol use: Yes    Alcohol/week: 14.0 standard drinks    Types: 14 Cans of beer per week   Drug use: No     Allergies   Patient has no known allergies.   Review of Systems Review of Systems  Genitourinary:  Negative for difficulty urinating, flank pain, frequency and urgency.  Musculoskeletal:  Positive for back pain.  All other systems reviewed and are negative.   Physical Exam Triage Vital Signs ED Triage Vitals [01/08/21 0840]  Enc Vitals Group     BP 106/69     Pulse Rate 75     Resp 20     Temp 99.1 F (37.3 C)     Temp Source Oral     SpO2 100 %     Weight       Height      Head Circumference      Peak Flow      Pain Score      Pain Loc      Pain Edu?      Excl. in GC?    No data found.  Updated Vital Signs BP 106/69 (BP Location: Left Arm)   Pulse 75   Temp 99.1 F (37.3 C) (Oral)   Resp 20   LMP 12/08/2020   SpO2 100%   Visual Acuity Right Eye Distance:   Left Eye Distance:   Bilateral Distance:    Right Eye Near:   Left Eye Near:    Bilateral Near:     Physical Exam Vitals and nursing note reviewed.  Constitutional:      General: She is not in acute distress.    Appearance: Normal appearance. She is not ill-appearing, toxic-appearing or diaphoretic.  HENT:     Head: Normocephalic and atraumatic.  Eyes:     Conjunctiva/sclera: Conjunctivae normal.  Cardiovascular:     Rate and Rhythm: Normal rate.     Pulses: Normal pulses.  Pulmonary:     Effort: Pulmonary effort is normal.  Abdominal:     General: Abdomen is flat.  Genitourinary:    Comments: declines Musculoskeletal:        General: Normal range of motion.     Cervical back: Normal and normal range of motion.     Thoracic back: Normal.     Lumbar back: Spasms and tenderness (right side) present. No bony tenderness. Normal range of motion. Negative right straight leg raise test and negative left straight leg raise test.  Skin:    General: Skin is warm and dry.  Neurological:     General: No focal deficit present.     Mental Status: She is alert and oriented to person, place, and time.  Psychiatric:        Mood and Affect: Mood normal.     UC Treatments / Results  Labs (all labs ordered are listed, but only abnormal results are displayed) Labs Reviewed - No data to display  EKG   Radiology No results found.  Procedures Procedures (including critical care time)  Medications Ordered in UC Medications  ketorolac (TORADOL) 30 MG/ML injection 30 mg (has no administration in time range)  dexamethasone (DECADRON) injection 10 mg (has no  administration in time range)    Initial Impression / Assessment and Plan / UC Course  I have reviewed the triage vital signs and the nursing notes.  Pertinent labs &  imaging results that were available during my care of the patient were reviewed by me and considered in my medical decision making (see chart for details).    Assessment negative for red flags or concerns. Muscles spasms of back, strain of lumbar region Decadron and Toradol IM administered in office for back pain.  Prescribed ibuprofen as needed for pain and tizanidine as needed for muscle pain and spasms.  May also take Tylenol as needed for breakthrough pain.  Encourage fluids and rest.  May use heat, ice, or alternate between heat and ice for comfort.  Recommend following up with primary care and PCP assistance started. Subacute vaginitis Mycostatin cream twice a day as needed for itching.  Keep the area clean and dry.  Avoid tight fitting clothing and try to wear breathable fabrics.  Follow-up with primary care Final Clinical Impressions(s) / UC Diagnoses   Final diagnoses:  Muscle spasm of back  Strain of lumbar region, initial encounter  Subacute vaginitis     Discharge Instructions      Take the Ibuprofen as needed for pain.  You can also take Tylenol as needed for pain and fevers.  Take the Zanaflex as needed for muscle pain and spasms.  It may make you sleepy so do not take it prior to driving.    Rest and drink plenty of fluids.   You can use heat, ice, or alternate between heat and ice for comfort.    Apply the mycostatin cream as needed for itching.  Keep the area clean and dry.  Avoid tight fitting clothing and try to only wear breathable fabrics.    Someone will contact you soon to help get you set up with a primary care provider.    Return or go to the Emergency Department if symptoms worsen or do not improve in the next few days.      ED Prescriptions     Medication Sig Dispense Auth. Provider    nystatin cream (MYCOSTATIN) Apply to affected area 2 times daily 30 g Ivette Loyal, NP   tiZANidine (ZANAFLEX) 4 MG tablet Take 1 tablet (4 mg total) by mouth every 6 (six) hours as needed for muscle spasms. 30 tablet Chales Salmon R, NP   ibuprofen (ADVIL) 600 MG tablet Take 1 tablet (600 mg total) by mouth every 6 (six) hours as needed. 30 tablet Ivette Loyal, NP      PDMP not reviewed this encounter.   Ivette Loyal, NP 01/08/21 0900

## 2021-01-08 NOTE — Discharge Instructions (Addendum)
Take the Ibuprofen as needed for pain.  You can also take Tylenol as needed for pain and fevers.  Take the Zanaflex as needed for muscle pain and spasms.  It may make you sleepy so do not take it prior to driving.    Rest and drink plenty of fluids.   You can use heat, ice, or alternate between heat and ice for comfort.    Apply the mycostatin cream as needed for itching.  Keep the area clean and dry.  Avoid tight fitting clothing and try to only wear breathable fabrics.    Someone will contact you soon to help get you set up with a primary care provider.    Return or go to the Emergency Department if symptoms worsen or do not improve in the next few days.

## 2021-02-28 ENCOUNTER — Encounter (HOSPITAL_COMMUNITY): Payer: Self-pay

## 2021-02-28 ENCOUNTER — Other Ambulatory Visit: Payer: Self-pay

## 2021-02-28 ENCOUNTER — Ambulatory Visit (HOSPITAL_COMMUNITY)
Admission: EM | Admit: 2021-02-28 | Discharge: 2021-02-28 | Disposition: A | Discharge status: Home / Self Care | Payer: Medicaid Other | Attending: Medical Oncology | Admitting: Medical Oncology

## 2021-02-28 DIAGNOSIS — B354 Tinea corporis: Secondary | ICD-10-CM

## 2021-02-28 MED ORDER — FLUCONAZOLE 150 MG PO TABS
150.0000 mg | ORAL_TABLET | Freq: Every day | ORAL | 0 refills | Status: DC
Start: 2021-02-28 — End: 2021-05-05

## 2021-02-28 MED ORDER — NYSTATIN 100000 UNIT/GM EX CREA
TOPICAL_CREAM | CUTANEOUS | 0 refills | Status: DC
Start: 2021-02-28 — End: 2021-07-22

## 2021-02-28 MED ORDER — CETIRIZINE HCL 10 MG PO TABS
10.0000 mg | ORAL_TABLET | Freq: Every day | ORAL | 0 refills | Status: DC
Start: 2021-02-28 — End: 2023-06-30

## 2021-02-28 NOTE — ED Provider Notes (Signed)
[facility]    CSN: [id number] Arrival date & time: 02/28/21  8250      History   Chief Complaint Chief Complaint  Patient presents with   skin irritation    HPI Stephanie Richards is a 45 y.o. female.   HPI  Pruritic Rash: Patient states that she ran out of a cream and pills that she was given for itchy skin in her groin, and below breast area. She states that this cream was effective.  The cream was nystatin and the pills were cetirizine. No new symptoms or changes. No skin breakdown, discharge or fevers.   Past Medical History:  Diagnosis Date   Arthritis    Asthma     There are no problems to display for this patient.   Past Surgical History:  Procedure Laterality Date   CESAREAN SECTION  2010, 2000   INCISION AND DRAINAGE Left 11/17/2019   Procedure: INCISION AND DRAINAGE LEFT LONG FINGER;  Surgeon: Bradly Bienenstock, MD;  Location: [facility];  Service: Orthopedics;  Laterality: Left;    OB History   No obstetric history on file.      Home Medications    Prior to Admission medications   Medication Sig Start Date End Date Taking? Authorizing Provider  albuterol (VENTOLIN HFA) 108 (90 Base) MCG/ACT inhaler Inhale 1-2 puffs into the lungs every 6 (six) hours as needed for wheezing or shortness of breath. 09/08/19   Mardella Layman, MD  cetirizine (ZYRTEC ALLERGY) 10 MG tablet Take 1 tablet (10 mg total) by mouth daily. Patient not taking: Reported on 01/08/2021 12/01/20   Particia Nearing, PA-C  ibuprofen (ADVIL) 600 MG tablet Take 1 tablet (600 mg total) by mouth every 6 (six) hours as needed. 01/08/21   Ivette Loyal, NP  nystatin cream (MYCOSTATIN) Apply to affected area 2 times daily 01/08/21   Ivette Loyal, NP  permethrin (ELIMITE) 5 % cream Apply all over the body from the neck to the soles of the feet including the hands and feet. Wash off after 8-14 hours then reapply in 1 week. Patient not taking: Reported on 01/08/2021 11/03/20    Rushie Chestnut, PA-C  predniSONE (DELTASONE) 20 MG tablet Take 2 tablets (40 mg total) by mouth daily with breakfast. Patient not taking: Reported on 01/08/2021 12/01/20   Particia Nearing, PA-C  tiZANidine (ZANAFLEX) 4 MG tablet Take 1 tablet (4 mg total) by mouth every 6 (six) hours as needed for muscle spasms. 01/08/21   Ivette Loyal, NP    Family History Family History  Problem Relation Age of Onset   Healthy Mother    Healthy Father     Social History Social History   Tobacco Use   Smoking status: Every Day    Packs/day: 0.50    Types: Cigarettes   Smokeless tobacco: Never  Vaping Use   Vaping Use: Never used  Substance Use Topics   Alcohol use: Yes    Alcohol/week: 14.0 standard drinks    Types: 14 Cans of beer per week   Drug use: No     Allergies   Patient has no known allergies.   Review of Systems Review of Systems  As stated above in HPI Physical Exam Triage Vital Signs ED Triage Vitals  Enc Vitals Group     BP 02/28/21 0907 100/68     Pulse Rate 02/28/21 0907 97     Resp 02/28/21 0907 18     Temp 02/28/21  0907 98.5 F (36.9 C)     Temp Source 02/28/21 0907 Oral     SpO2 02/28/21 0907 98 %     Weight --      Height --      Head Circumference --      Peak Flow --      Pain Score 02/28/21 0909 0     Pain Loc --      Pain Edu? --      Excl. in GC? --    No data found.  Updated Vital Signs BP 100/68 (BP Location: Left Arm)   Pulse 97   Temp 98.5 F (36.9 C) (Oral)   Resp 18   LMP 02/19/2021 (Approximate)   SpO2 98%   Physical Exam Vitals and nursing note reviewed.  Constitutional:      General: She is not in acute distress.    Appearance: Normal appearance. She is not ill-appearing, toxic-appearing or diaphoretic.  Skin:    Findings: Rash (well demarcated hyperpigmented rash of the groin and inferior breast area.) present.  Neurological:     Mental Status: She is alert.     UC Treatments / Results  Labs (all labs  ordered are listed, but only abnormal results are displayed) Labs Reviewed - No data to display  EKG   Radiology No results found.  Procedures Procedures (including critical care time)  Medications Ordered in UC Medications - No data to display  Initial Impression / Assessment and Plan / UC Course  I have reviewed the triage vital signs and the nursing notes.  Pertinent labs & imaging results that were available during my care of the patient were reviewed by me and considered in my medical decision making (see chart for details).     New.  I am going to refill these medications for her and I am also going to send in Diflucan 1 tablet once weekly for her to take to help fully stop the overgrowth.  If this does not resolve symptoms I would recommend that she see a dermatologist or her PCP for further work-up.  Final Clinical Impressions(s) / UC Diagnoses   Final diagnoses:  None   Discharge Instructions   None    ED Prescriptions   None    PDMP not reviewed this encounter.   Rushie Chestnut, [place] 02/28/21 [phone]

## 2021-02-28 NOTE — ED Triage Notes (Signed)
Pt in with c/o itchy skin to her arms that has been going on for a few days  States she was seen before and using an ointment, but she ran out and the itching has started again

## 2021-04-14 IMAGING — DX DG FINGER MIDDLE 2+V*L*
3 series · 3 of 3 positions shown · non-contrast
Comparison: None.

CLINICAL DATA: Fall 1 week ago with third digit pain, initial
encounter

EXAM:
LEFT MIDDLE FINGER 2+V

[finger ap]
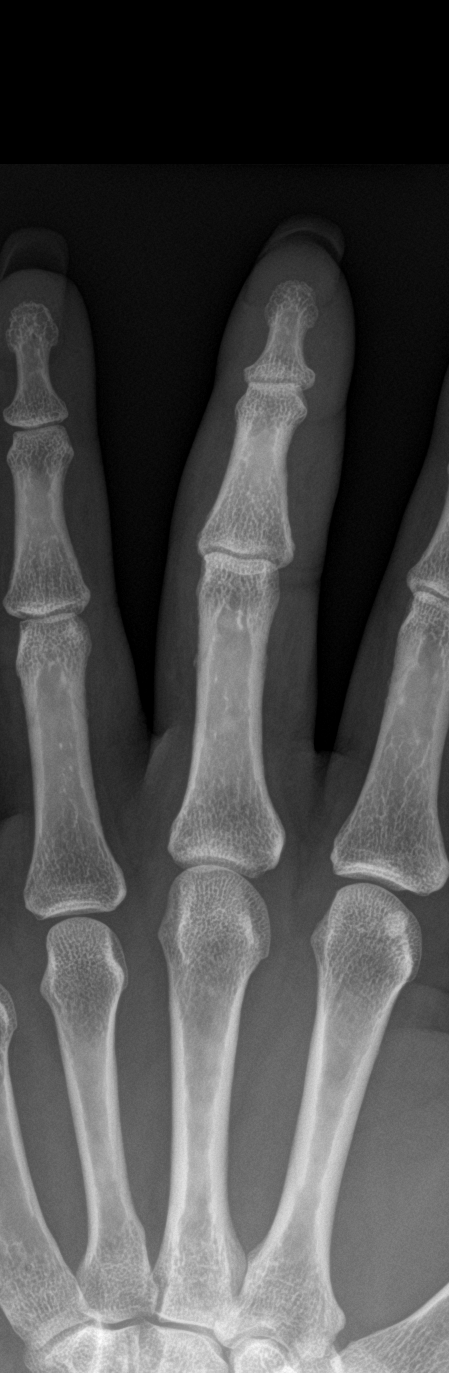

[finger obl]
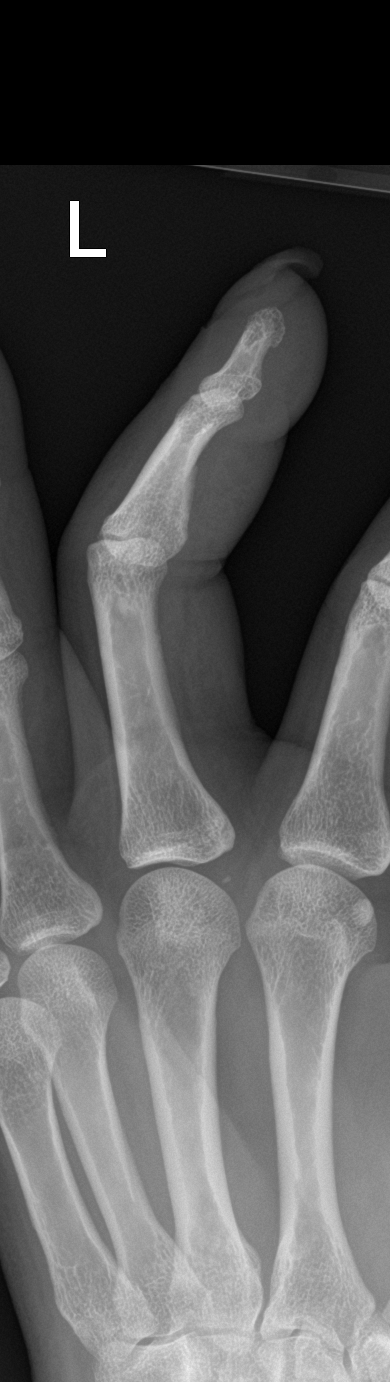

[finger lat]
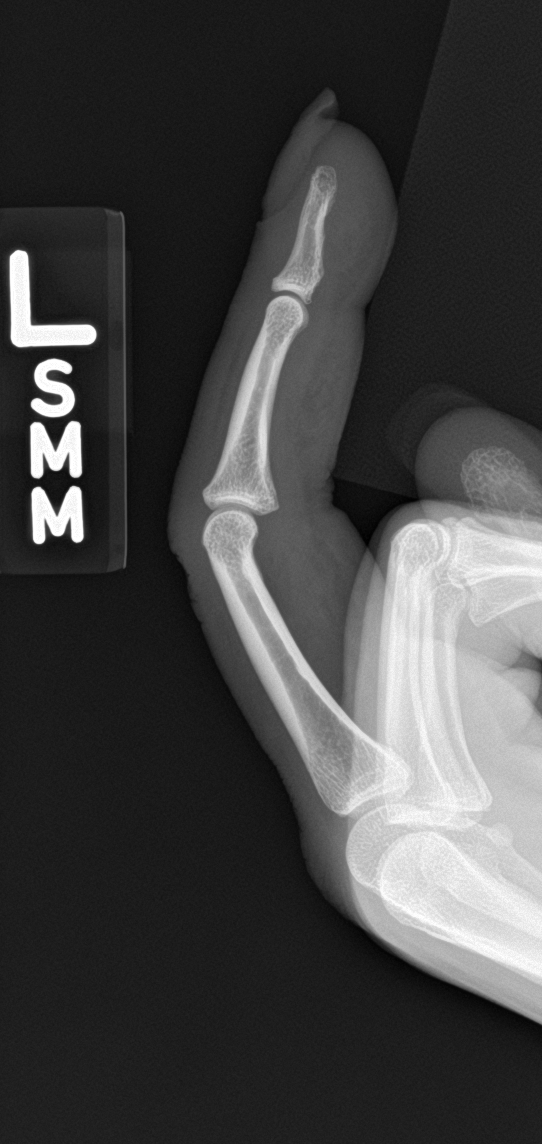

[3 of 3 positions shown; findings below may reference images not displayed]

FINDINGS: On the oblique image there is a tiny bony density identified
adjacent to the third MCP joint which may represent a small avulsion
from the base of the proximal phalanx. No other fracture or
dislocation is seen. Mild soft tissue swelling is noted.
IMPRESSION: Tiny bony density adjacent to the third MCP joint which may
represent a small avulsion fracture.

## 2021-05-05 ENCOUNTER — Encounter (HOSPITAL_COMMUNITY): Payer: Self-pay | Admitting: Emergency Medicine

## 2021-05-05 ENCOUNTER — Ambulatory Visit (HOSPITAL_COMMUNITY)
Admission: EM | Admit: 2021-05-05 | Discharge: 2021-05-05 | Disposition: A | Discharge status: Home / Self Care | Payer: Medicaid Other | Attending: Internal Medicine | Admitting: Internal Medicine

## 2021-05-05 ENCOUNTER — Telehealth (HOSPITAL_COMMUNITY): Payer: Self-pay

## 2021-05-05 ENCOUNTER — Other Ambulatory Visit: Payer: Self-pay

## 2021-05-05 DIAGNOSIS — B354 Tinea corporis: Secondary | ICD-10-CM

## 2021-05-05 MED ORDER — CLOTRIMAZOLE-BETAMETHASONE 1-0.05 % EX CREA: TOPICAL_CREAM | Freq: Two times a day (BID) | CUTANEOUS | 0 refills | Status: AC

## 2021-05-05 MED ORDER — TERBINAFINE HCL 250 MG PO TABS: 250.0000 mg | ORAL_TABLET | Freq: Every day | ORAL | 0 refills | Status: AC

## 2021-05-05 MED ORDER — TERBINAFINE HCL 250 MG PO TABS: 250.0000 mg | ORAL_TABLET | Freq: Every day | ORAL | 0 refills | Status: DC

## 2021-05-05 NOTE — ED Provider Notes (Signed)
[facility]    CSN: [id number] Arrival date & time: 05/05/21  1500      History   Chief Complaint Chief Complaint  Patient presents with   Vaginal Itching    HPI Stephanie Richards is a 45 y.o. female comes to the urgent care requesting medications to be refilled.  Patient was diagnosed with tinea corporis and tinea cruris a couple of weeks ago.  She was prescribed nystatin cream and weekly fluconazole.  Patient lost her medications and she could not complete the course of her treatment.  Her symptoms have worsened.  She continues to have rash under her breasts and in the groin area..  No fever or chills.   HPI  Past Medical History:  Diagnosis Date   Arthritis    Asthma     There are no problems to display for this patient.   Past Surgical History:  Procedure Laterality Date   CESAREAN SECTION  2010, 2000   INCISION AND DRAINAGE Left 11/17/2019   Procedure: INCISION AND DRAINAGE LEFT LONG FINGER;  Surgeon: Bradly Bienenstock, MD;  Location: [facility];  Service: Orthopedics;  Laterality: Left;    OB History   No obstetric history on file.      Home Medications    Prior to Admission medications   Medication Sig Start Date End Date Taking? Authorizing Provider  clotrimazole-betamethasone (LOTRISONE) cream Apply topically 2 (two) times daily for 7 days. Apply to affected area 2 times daily prn 05/05/21 05/12/21 Yes Dijon Kohlman, Britta Mccreedy, MD  terbinafine (LAMISIL) 250 MG tablet Take 1 tablet (250 mg total) by mouth daily for 7 days. 05/05/21 05/12/21 Yes Dallas Scorsone, Britta Mccreedy, MD  albuterol (VENTOLIN HFA) 108 (90 Base) MCG/ACT inhaler Inhale 1-2 puffs into the lungs every 6 (six) hours as needed for wheezing or shortness of breath. 09/08/19   Mardella Layman, MD  cetirizine (ZYRTEC ALLERGY) 10 MG tablet Take 1 tablet (10 mg total) by mouth daily. 02/28/21   Rushie Chestnut, PA-C  ibuprofen (ADVIL) 600 MG tablet Take 1 tablet (600 mg total) by mouth every 6  (six) hours as needed. 01/08/21   Ivette Loyal, NP  nystatin cream (MYCOSTATIN) Apply to affected area 2 times daily 02/28/21   Clent Jacks M, PA-C  tiZANidine (ZANAFLEX) 4 MG tablet Take 1 tablet (4 mg total) by mouth every 6 (six) hours as needed for muscle spasms. 01/08/21   Ivette Loyal, NP    Family History Family History  Problem Relation Age of Onset   Healthy Mother    Healthy Father     Social History Social History   Tobacco Use   Smoking status: Every Day    Packs/day: 0.50    Types: Cigarettes   Smokeless tobacco: Never  Vaping Use   Vaping Use: Never used  Substance Use Topics   Alcohol use: Yes    Alcohol/week: 14.0 standard drinks    Types: 14 Cans of beer per week   Drug use: No     Allergies   Patient has no known allergies.   Review of Systems Review of Systems As per HPI  Physical Exam Triage Vital Signs ED Triage Vitals  Enc Vitals Group     BP 05/05/21 1637 118/79     Pulse Rate 05/05/21 1637 78     Resp 05/05/21 1637 18     Temp 05/05/21 1637 98.4 F (36.9 C)     Temp Source 05/05/21 1637 Oral  SpO2 05/05/21 1637 98 %     Weight --      Height --      Head Circumference --      Peak Flow --      Pain Score 05/05/21 1635 0     Pain Loc --      Pain Edu? --      Excl. in GC? --    No data found.  Updated Vital Signs BP 118/79 (BP Location: Right Arm)   Pulse 78   Temp 98.4 F (36.9 C) (Oral)   Resp 18   LMP 04/28/2021   SpO2 98%   Visual Acuity Right Eye Distance:   Left Eye Distance:   Bilateral Distance:    Right Eye Near:   Left Eye Near:    Bilateral Near:     Physical Exam Vitals and nursing note reviewed.  Constitutional:      General: She is not in acute distress.    Appearance: She is not ill-appearing.  Cardiovascular:     Rate and Rhythm: Normal rate and regular rhythm.  Pulmonary:     Effort: Pulmonary effort is normal.     Breath sounds: Normal breath sounds.  Musculoskeletal:         General: Normal range of motion.  Skin:    General: Skin is warm.     Comments: Fungal rash under the breast.  Neurological:     Mental Status: She is alert.     UC Treatments / Results  Labs (all labs ordered are listed, but only abnormal results are displayed) Labs Reviewed - No data to display  EKG   Radiology No results found.  Procedures Procedures (including critical care time)  Medications Ordered in UC Medications - No data to display  Initial Impression / Assessment and Plan / UC Course  I have reviewed the triage vital signs and the nursing notes.  Pertinent labs & imaging results that were available during my care of the patient were reviewed by me and considered in my medical decision making (see chart for details).     1.  Tinea infection: Lamisil 250 mg orally daily for 7 days Clotrimazole/beclomethasone cream to be applied twice daily for the next 7 days. Compliance with medication reemphasized. Return to urgent care if symptoms are persistent. Final Clinical Impressions(s) / UC Diagnoses   Final diagnoses:  Tinea corporis     Discharge Instructions      Please take medications as prescribed Return to urgent care if you have worsening symptoms   ED Prescriptions     Medication Sig Dispense Auth. Provider   terbinafine (LAMISIL) 250 MG tablet Take 1 tablet (250 mg total) by mouth daily for 7 days. 7 tablet Shristi Scheib, Britta Mccreedy, MD   clotrimazole-betamethasone (LOTRISONE) cream Apply topically 2 (two) times daily for 7 days. Apply to affected area 2 times daily prn 15 g Dorethia Jeanmarie, Britta Mccreedy, MD      PDMP not reviewed this encounter.   Merrilee Jansky, MD 05/05/21 [phone]

## 2021-05-05 NOTE — ED Triage Notes (Signed)
Pt c/o vaginal itching. Reports her medications that she got last time here lost and didn't get to finish taking.

## 2021-05-05 NOTE — Discharge Instructions (Addendum)
Please take medications as prescribed Return to urgent care if you have worsening symptoms

## 2021-07-22 ENCOUNTER — Ambulatory Visit (HOSPITAL_COMMUNITY)
Admission: EM | Admit: 2021-07-22 | Discharge: 2021-07-22 | Disposition: A | Discharge status: Home / Self Care | Payer: Medicaid Other | Attending: Student | Admitting: Student

## 2021-07-22 ENCOUNTER — Other Ambulatory Visit: Payer: Self-pay

## 2021-07-22 ENCOUNTER — Encounter (HOSPITAL_COMMUNITY): Payer: Self-pay | Admitting: Emergency Medicine

## 2021-07-22 DIAGNOSIS — N898 Other specified noninflammatory disorders of vagina: Secondary | ICD-10-CM | POA: Diagnosis not present

## 2021-07-22 DIAGNOSIS — L853 Xerosis cutis: Secondary | ICD-10-CM

## 2021-07-22 DIAGNOSIS — S39012A Strain of muscle, fascia and tendon of lower back, initial encounter: Secondary | ICD-10-CM

## 2021-07-22 MED ORDER — TIZANIDINE HCL 4 MG PO TABS: 4.0000 mg | ORAL_TABLET | Freq: Four times a day (QID) | ORAL | 0 refills | Status: DC | PRN

## 2021-07-22 MED ORDER — HYDROCORTISONE 2.5 % EX LOTN: TOPICAL_LOTION | Freq: Two times a day (BID) | CUTANEOUS | 0 refills | Status: AC

## 2021-07-22 MED ORDER — KETOROLAC TROMETHAMINE 30 MG/ML IJ SOLN
30.0000 mg | Freq: Once | INTRAMUSCULAR | Status: AC
  Administered 2021-07-22: 30 mg via INTRAMUSCULAR

## 2021-07-22 MED ORDER — KETOROLAC TROMETHAMINE 30 MG/ML IJ SOLN
INTRAMUSCULAR | Status: AC
  Filled 2021-07-22: qty 1

## 2021-07-22 MED ORDER — NYSTATIN 100000 UNIT/GM EX CREA: TOPICAL_CREAM | CUTANEOUS | 0 refills | Status: DC

## 2021-07-22 NOTE — ED Triage Notes (Addendum)
Complains of back pain, this episode started 2 days ago.  Reports she has a history of back spasms and is requesting " that shot ".  Patient is also requesting a cream for generalized itching.  Denies injury.  Right lower back pain, pain in right buttocks, no pain in leg

## 2021-07-22 NOTE — ED Provider Notes (Signed)
[place]    CSN: [id number] Arrival date & time: 07/22/21  1018      History   Chief Complaint Chief Complaint  Patient presents with   Back Pain    HPI Stephanie Richards is a 46 y.o. female presenting with back pain x2 days. Medical history same in the past - last 12/2020, treated with Toradol and Dexamethasone IM, and zanflex PO, with resolution.  Today describes 2 days of spasms in her lower back.  Pain radiating down the right buttocks, but does not extend down the leg.  Consistent with sciatica. Denies numbness in arms/legs, denies weakness in arms/legs, denies saddle anesthesia, denies bowel/bladder incontinence, denies urinary retention, denies constipation.  Also with itchy skin x3 days. Denies changes in routine/ new products. Uses Dove sensitive bodywash. Has not tried interventions at home. Has not used any moisturizer.   Also with vaginal itching x 3 days- external genitalia only. Denies vaginal discharge. Denies hematuria, dysuria, frequency, urgency, back pain, n/v/d/abd pain, fevers/chills, abdnormal vaginal discharge. Denies STI risk.    HPI  Past Medical History:  Diagnosis Date   Arthritis    Asthma     There are no problems to display for this patient.   Past Surgical History:  Procedure Laterality Date   CESAREAN SECTION  2010, 2000   INCISION AND DRAINAGE Left 11/17/2019   Procedure: INCISION AND DRAINAGE LEFT LONG FINGER;  Surgeon: Iran Planas, MD;  Location: [place];  Service: Orthopedics;  Laterality: Left;    OB History   No obstetric history on file.      Home Medications    Prior to Admission medications   Medication Sig Start Date End Date Taking? Authorizing Provider  hydrocortisone 2.5 % lotion Apply topically 2 (two) times daily for 7 days. 07/22/21 07/29/21 Yes Hazel Sams, PA-C  albuterol (VENTOLIN HFA) 108 (90 Base) MCG/ACT inhaler Inhale 1-2 puffs into the lungs every 6 (six) hours as needed for  wheezing or shortness of breath. Patient not taking: Reported on 07/22/2021 09/08/19   Vanessa Kick, MD  cetirizine (ZYRTEC ALLERGY) 10 MG tablet Take 1 tablet (10 mg total) by mouth daily. Patient not taking: Reported on 07/22/2021 02/28/21   Hughie Closs, PA-C  ibuprofen (ADVIL) 600 MG tablet Take 1 tablet (600 mg total) by mouth every 6 (six) hours as needed. 01/08/21   Pearson Forster, NP  nystatin cream (MYCOSTATIN) Apply to affected area 2 times daily 07/22/21   Hazel Sams, PA-C  tiZANidine (ZANAFLEX) 4 MG tablet Take 1 tablet (4 mg total) by mouth every 6 (six) hours as needed for muscle spasms. 07/22/21   Hazel Sams, PA-C    Family History Family History  Problem Relation Age of Onset   Healthy Mother    Healthy Father     Social History Social History   Tobacco Use   Smoking status: Every Day    Packs/day: 0.50    Types: Cigarettes   Smokeless tobacco: Never  Vaping Use   Vaping Use: Never used  Substance Use Topics   Alcohol use: Yes    Alcohol/week: 14.0 standard drinks    Types: 14 Cans of beer per week   Drug use: Yes    Types: Marijuana     Allergies   Patient has no known allergies.   Review of Systems Review of Systems  Musculoskeletal:  Positive for back pain.  All other systems reviewed and are negative.   Physical  Exam Triage Vital Signs ED Triage Vitals  Enc Vitals Group     BP 07/22/21 1152 108/69     Pulse Rate 07/22/21 1152 74     Resp 07/22/21 1152 20     Temp 07/22/21 1152 98.6 F (37 C)     Temp Source 07/22/21 1152 Oral     SpO2 07/22/21 1152 100 %     Weight --      Height --      Head Circumference --      Peak Flow --      Pain Score 07/22/21 1150 8     Pain Loc --      Pain Edu? --      Excl. in [place]? --    No data found.  Updated Vital Signs BP 108/69 (BP Location: Left Arm)    Pulse 74    Temp 98.6 F (37 C) (Oral)    Resp 20    LMP 07/09/2021    SpO2 100%   Visual Acuity Right Eye Distance:   Left Eye  Distance:   Bilateral Distance:    Right Eye Near:   Left Eye Near:    Bilateral Near:     Physical Exam Vitals reviewed.  Constitutional:      General: She is not in acute distress.    Appearance: Normal appearance. She is not ill-appearing.  HENT:     Head: Normocephalic and atraumatic.  Cardiovascular:     Rate and Rhythm: Normal rate and regular rhythm.     Heart sounds: Normal heart sounds.  Pulmonary:     Effort: Pulmonary effort is normal.     Breath sounds: Normal breath sounds and air entry.  Abdominal:     Tenderness: There is no abdominal tenderness. There is no right CVA tenderness, left CVA tenderness, guarding or rebound.  Genitourinary:    Comments: deferred Musculoskeletal:     Cervical back: Normal range of motion. No swelling, deformity, signs of trauma, rigidity, spasms, tenderness, bony tenderness or crepitus. No pain with movement.     Thoracic back: No swelling, deformity, signs of trauma, spasms, tenderness or bony tenderness. Normal range of motion. No scoliosis.     Lumbar back: Spasms and tenderness present. No swelling, deformity, signs of trauma or bony tenderness. Normal range of motion. Negative right straight leg raise test and negative left straight leg raise test. No scoliosis.     Comments: R lumbar paraspinous tenderness to palpation. Negative straight leg raise. Strength and sensation intact. No midline spinous tenderness, deformity, stepoff.  Absolutely no other injury, deformity, tenderness, ecchymosis, abrasion.  Skin:    Comments: Dry scaly skin throughout with few excoriations. No burrows interdigital webs. No evidence of tinea.  Neurological:     General: No focal deficit present.     Mental Status: She is alert.     Cranial Nerves: No cranial nerve deficit.  Psychiatric:        Mood and Affect: Mood normal.        Behavior: Behavior normal.        Thought Content: Thought content normal.        Judgment: Judgment normal.     UC  Treatments / Results  Labs (all labs ordered are listed, but only abnormal results are displayed) Labs Reviewed - No data to display  EKG   Radiology No results found.  Procedures Procedures (including critical care time)  Medications Ordered in UC Medications  ketorolac (TORADOL) 30 MG/ML  injection 30 mg (has no administration in time range)    Initial Impression / Assessment and Plan / UC Course  I have reviewed the triage vital signs and the nursing notes.  Pertinent labs & imaging results that were available during my care of the patient were reviewed by me and considered in my medical decision making (see chart for details).     This patient is a very pleasant 46 y.o. year old female presenting with lumbar strain and dry skin and vaginal irritation. Afebrile, nontachy. No red flag symptoms.   Has not taken any medications today. Toradol IM administered. Zanaflex sent.   For vaginal irritation- subacute vaginitis consistent with past symptoms, will sent mycostatin which worked for her last time. Declines cervicovaginal swab. States she is not pregnant or breastfeeding.  For dry skin- she does not use any moisturizer. Rec thick emollient moisturizer. Also sent hydrocortisone cream   ED return precautions discussed. Patient verbalizes understanding and agreement.      Final Clinical Impressions(s) / UC Diagnoses   Final diagnoses:  Strain of lumbar region, initial encounter  Dry skin dermatitis  Vaginal itching     Discharge Instructions      -Mycostatin cream for vaginal itching -Hydrocortisone cream as needed for dry skin, I also recommend purchasing a thick fragrance free moisturizer like CeraVe and applying this twice daily. -Zanaflex as needed for muscle spasms, this medication can make you drowsy. -You can take Tylenol up to 1000 mg 3 times daily, and ibuprofen up to 600 mg 3 times daily with food.  You can take these together, or alternate every 3-4  hours.  Avoid ibuprofen the rest of today as we administered Toradol today. -Heating pad for back pain.     ED Prescriptions     Medication Sig Dispense Auth. Provider   nystatin cream (MYCOSTATIN) Apply to affected area 2 times daily 30 g Hazel Sams, PA-C   tiZANidine (ZANAFLEX) 4 MG tablet Take 1 tablet (4 mg total) by mouth every 6 (six) hours as needed for muscle spasms. 30 tablet Hazel Sams, PA-C   hydrocortisone 2.5 % lotion Apply topically 2 (two) times daily for 7 days. 59 mL Hazel Sams, PA-C      PDMP not reviewed this encounter.   Hazel Sams, PA-C 07/22/21 1255

## 2021-07-22 NOTE — Discharge Instructions (Addendum)
-  Mycostatin cream for vaginal itching -Hydrocortisone cream as needed for dry skin, I also recommend purchasing a thick fragrance free moisturizer like CeraVe and applying this twice daily. -Zanaflex as needed for muscle spasms, this medication can make you drowsy. -You can take Tylenol up to 1000 mg 3 times daily, and ibuprofen up to 600 mg 3 times daily with food.  You can take these together, or alternate every 3-4 hours.  Avoid ibuprofen the rest of today as we administered Toradol today. -Heating pad for back pain.

## 2021-07-31 ENCOUNTER — Encounter (HOSPITAL_COMMUNITY): Payer: Self-pay

## 2021-07-31 ENCOUNTER — Other Ambulatory Visit: Payer: Self-pay

## 2021-07-31 ENCOUNTER — Ambulatory Visit (HOSPITAL_COMMUNITY)
Admission: EM | Admit: 2021-07-31 | Discharge: 2021-07-31 | Disposition: A | Discharge status: Home / Self Care | Payer: Medicaid Other | Attending: Emergency Medicine | Admitting: Emergency Medicine

## 2021-07-31 DIAGNOSIS — R3989 Other symptoms and signs involving the genitourinary system: Secondary | ICD-10-CM | POA: Diagnosis not present

## 2021-07-31 DIAGNOSIS — N761 Subacute and chronic vaginitis: Secondary | ICD-10-CM

## 2021-07-31 MED ORDER — METHYLPREDNISOLONE SODIUM SUCC 125 MG IJ SOLR
INTRAMUSCULAR | Status: AC
  Filled 2021-07-31: qty 2

## 2021-07-31 MED ORDER — TERBINAFINE HCL 250 MG PO TABS: 250.0000 mg | ORAL_TABLET | Freq: Every day | ORAL | 0 refills | Status: AC

## 2021-07-31 MED ORDER — VALACYCLOVIR HCL 1 G PO TABS
1000.0000 mg | ORAL_TABLET | Freq: Three times a day (TID) | ORAL | 0 refills | Status: AC
Start: 2021-07-31 — End: 2021-08-14

## 2021-07-31 MED ORDER — PREDNISONE 20 MG PO TABS: 40.0000 mg | ORAL_TABLET | Freq: Every day | ORAL | 0 refills | Status: DC

## 2021-07-31 MED ORDER — NYSTATIN 100000 UNIT/GM EX CREA: TOPICAL_CREAM | CUTANEOUS | 2 refills | Status: DC

## 2021-07-31 MED ORDER — METHYLPREDNISOLONE SODIUM SUCC 125 MG IJ SOLR
60.0000 mg | Freq: Once | INTRAMUSCULAR | Status: AC
  Administered 2021-07-31: 60 mg via INTRAMUSCULAR

## 2021-07-31 MED ORDER — DERMOPLAST 20-0.5 % EX AERO: 1.0000 "application " | INHALATION_SPRAY | Freq: Four times a day (QID) | CUTANEOUS | 0 refills | Status: DC | PRN

## 2021-07-31 NOTE — Discharge Instructions (Signed)
Take Lamisil tablet every morning type for the next 7 days to cover for fungus  You may apply nystatin cream twice a day to cover for fungus  Take prednisone every morning with food beginning tomorrow for the next 5 days to help with inflammation and swelling  Take valacyclovir 3 times a day for the next 14 days to cover for viruses  You may spray Dermoplast up to 4 times a day (every 6 hours) over vaginal area for general comfort  You have been given information for a dermatologist which is a skin doctor and a gynecologist which is a vaginal doctor to follow-up with for further evaluation of your symptoms

## 2021-07-31 NOTE — ED Provider Notes (Signed)
[place]    CSN: [id number] Arrival date & time: 07/31/21  1339      History   Chief Complaint Chief Complaint  Patient presents with   Vaginal Itching   Rash    HPI Stephanie Richards is a 46 y.o. female.   Patient presents with severe vaginal itching.  Denies vaginal discharge, rash, lesions, dysuria, urinary frequency or urgency, abdominal pain, flank pain, fever, chills.  Endorses that she has had similar symptoms occurring intermittently for the last 6 months.  Was seen on 07/22/2021, prescribed topical cream which had been effective but symptoms returned as soon as medication was discontinued.  Denies sexual activity.   Past Medical History:  Diagnosis Date   Arthritis    Asthma     There are no problems to display for this patient.   Past Surgical History:  Procedure Laterality Date   CESAREAN SECTION  2010, 2000   INCISION AND DRAINAGE Left 11/17/2019   Procedure: INCISION AND DRAINAGE LEFT LONG FINGER;  Surgeon: Iran Planas, MD;  Location: [place];  Service: Orthopedics;  Laterality: Left;    OB History   No obstetric history on file.      Home Medications    Prior to Admission medications   Medication Sig Start Date End Date Taking? Authorizing Provider  albuterol (VENTOLIN HFA) 108 (90 Base) MCG/ACT inhaler Inhale 1-2 puffs into the lungs every 6 (six) hours as needed for wheezing or shortness of breath. Patient not taking: Reported on 07/22/2021 09/08/19   Vanessa Kick, MD  cetirizine (ZYRTEC ALLERGY) 10 MG tablet Take 1 tablet (10 mg total) by mouth daily. Patient not taking: Reported on 07/22/2021 02/28/21   Hughie Closs, PA-C  ibuprofen (ADVIL) 600 MG tablet Take 1 tablet (600 mg total) by mouth every 6 (six) hours as needed. 01/08/21   Pearson Forster, NP  nystatin cream (MYCOSTATIN) Apply to affected area 2 times daily 07/22/21   Hazel Sams, PA-C  tiZANidine (ZANAFLEX) 4 MG tablet Take 1 tablet (4 mg total) by  mouth every 6 (six) hours as needed for muscle spasms. 07/22/21   Hazel Sams, PA-C    Family History Family History  Problem Relation Age of Onset   Healthy Mother    Healthy Father     Social History Social History   Tobacco Use   Smoking status: Every Day    Packs/day: 0.50    Types: Cigarettes   Smokeless tobacco: Never  Vaping Use   Vaping Use: Never used  Substance Use Topics   Alcohol use: Yes    Alcohol/week: 14.0 standard drinks    Types: 14 Cans of beer per week   Drug use: Yes    Types: Marijuana     Allergies   Patient has no known allergies.   Review of Systems Review of Systems  Constitutional: Negative.   Respiratory: Negative.    Genitourinary:  Positive for vaginal pain. Negative for decreased urine volume, difficulty urinating, dyspareunia, dysuria, enuresis, flank pain, frequency, genital sores, hematuria, menstrual problem, pelvic pain, urgency, vaginal bleeding and vaginal discharge.  Skin:  Positive for rash. Negative for color change, pallor and wound.  Neurological: Negative.     Physical Exam Triage Vital Signs ED Triage Vitals  Enc Vitals Group     BP 07/31/21 1404 103/77     Pulse Rate 07/31/21 1402 (!) 120     Resp 07/31/21 1402 19     Temp 07/31/21 1402  98.7 F (37.1 C)     Temp Source 07/31/21 1402 Oral     SpO2 07/31/21 1402 100 %     Weight --      Height --      Head Circumference --      Peak Flow --      Pain Score 07/31/21 1401 10     Pain Loc --      Pain Edu? --      Excl. in [place]? --    No data found.  Updated Vital Signs BP 103/77    Pulse (!) 120    Temp 98.7 F (37.1 C) (Oral)    Resp 19    LMP 07/09/2021    SpO2 100%   Visual Acuity Right Eye Distance:   Left Eye Distance:   Bilateral Distance:    Right Eye Near:   Left Eye Near:    Bilateral Near:     Physical Exam Constitutional:      Appearance: Normal appearance.  HENT:     Head: Normocephalic.  Eyes:     Extraocular Movements:  Extraocular movements intact.  Pulmonary:     Effort: Pulmonary effort is normal.  Genitourinary:    Comments: Flesh tone to whitish maculopapular lesions present on the perineum, excoriations present on perineum, no discharge, swelling noted Skin:    General: Skin is warm and dry.  Neurological:     Mental Status: She is alert and oriented to person, place, and time. Mental status is at baseline.  Psychiatric:        Mood and Affect: Mood normal.        Behavior: Behavior normal.     UC Treatments / Results  Labs (all labs ordered are listed, but only abnormal results are displayed) Labs Reviewed - No data to display  EKG   Radiology No results found.  Procedures Procedures (including critical care time)  Medications Ordered in UC Medications - No data to display  Initial Impression / Assessment and Plan / UC Course  I have reviewed the triage vital signs and the nursing notes.  Pertinent labs & imaging results that were available during my care of the patient were reviewed by me and considered in my medical decision making (see chart for details).  Vaginitis Genital sore  Etiology of symptoms is unknown while symptoms are improved with use of antifungal cream, rashes not consistent with tinea corporis, herpes simplex, HPV or folliculitis, will trial multiple medications in an attempt to give patient relief, methylprednisolone injection given in office to try and alleviate some discomfort from rest as patient is crying in exam room, will prescribe Lamisil 250 mg daily for 7 days, nystatin cream twice daily as needed, prednisone 40 mg daily for 5 days, valacyclovir 1000 mg 3 times daily for 14 days and Dermoplast for comfort, given walking referral to dermatology and gynecology for further evaluation, as patient denies sexual activity and this has been consistent since her past visits we will defer STI testing at this time, work note given Final Clinical Impressions(s) / UC  Diagnoses   Final diagnoses:  None   Discharge Instructions   None    ED Prescriptions   None    PDMP not reviewed this encounter.   Hans Eden, NP 07/31/21 1458

## 2021-07-31 NOTE — ED Triage Notes (Signed)
Pt presents with c/o severe vaginal itching. States she had medication prescribed, it stopped and states it came back.   Pt states she is currently not sexually active.

## 2022-10-09 ENCOUNTER — Encounter (HOSPITAL_COMMUNITY): Payer: Self-pay | Admitting: Emergency Medicine

## 2022-10-09 ENCOUNTER — Ambulatory Visit (HOSPITAL_COMMUNITY)
Admission: EM | Admit: 2022-10-09 | Discharge: 2022-10-09 | Disposition: A | Discharge status: Home / Self Care | Payer: BC Managed Care – PPO | Attending: Internal Medicine | Admitting: Internal Medicine

## 2022-10-09 ENCOUNTER — Other Ambulatory Visit: Payer: Self-pay

## 2022-10-09 DIAGNOSIS — M545 Low back pain, unspecified: Secondary | ICD-10-CM | POA: Insufficient documentation

## 2022-10-09 DIAGNOSIS — T148XXA Other injury of unspecified body region, initial encounter: Secondary | ICD-10-CM | POA: Insufficient documentation

## 2022-10-09 DIAGNOSIS — J4521 Mild intermittent asthma with (acute) exacerbation: Secondary | ICD-10-CM

## 2022-10-09 DIAGNOSIS — J4520 Mild intermittent asthma, uncomplicated: Secondary | ICD-10-CM | POA: Diagnosis present

## 2022-10-09 DIAGNOSIS — B3731 Acute candidiasis of vulva and vagina: Secondary | ICD-10-CM

## 2022-10-09 MED ORDER — FLUCONAZOLE 40 MG/ML PO SUSR: 160.0000 mg | Freq: Every day | ORAL | 0 refills | Status: AC

## 2022-10-09 MED ORDER — CLOTRIMAZOLE 1 % EX CREA: TOPICAL_CREAM | CUTANEOUS | 0 refills | Status: DC

## 2022-10-09 MED ORDER — KETOROLAC TROMETHAMINE 30 MG/ML IJ SOLN
INTRAMUSCULAR | Status: AC
  Filled 2022-10-09: qty 1

## 2022-10-09 MED ORDER — KETOROLAC TROMETHAMINE 30 MG/ML IJ SOLN
15.0000 mg | Freq: Once | INTRAMUSCULAR | Status: AC
  Administered 2022-10-09: 15 mg via INTRAMUSCULAR

## 2022-10-09 MED ORDER — ALBUTEROL SULFATE HFA 108 (90 BASE) MCG/ACT IN AERS: 1.0000 | INHALATION_SPRAY | Freq: Four times a day (QID) | RESPIRATORY_TRACT | 0 refills | Status: AC | PRN

## 2022-10-09 MED ORDER — METHOCARBAMOL 500 MG PO TABS: 500.0000 mg | ORAL_TABLET | Freq: Two times a day (BID) | ORAL | 0 refills | Status: DC

## 2022-10-09 NOTE — ED Provider Notes (Signed)
[facility]    CSN: [id number] Arrival date & time: 10/09/22  [id number]      History   Chief Complaint Chief Complaint  Patient presents with   Back Pain    HPI Stephanie Richards is a 47 y.o. female.   Patient presents to urgent care for evaluation of left-sided lower back pain that started yesterday and is worse with movement.  Pain does not radiate to the bilateral lower extremities or to the abdomen.  Pain is described as a sharp cramp that is worse with certain movements.  Denies midline low back pain, saddle anesthesia symptoms, urinary symptoms, loss of bladder/bowel control, recent trauma/injury to the low back, and recent heavy lifting or change in exercise.  Last normal bowel movement was yesterday.  Denies constipation, blood/mucus in the stool, and diarrhea.  She states this has happened in the past and she has had muscle spasm.  No numbness or tingling to the bilateral lower extremities. She took [employer] at approximately 5 am this morning (5 hours ago) without much relief.  Pain to the left lower back is currently an 8 on a scale of 0-10.  She would also like to be evaluated for vaginal rash and vaginal irritation that started approximately 1 week ago but has worsened significantly over the last week since onset.  Reports significant vaginal irritation and itching.  No recent new sexual contacts or exposure to STDs to her knowledge.  Denies chance of pregnancy as she has not been sexually active recently.  Denies vaginal discharge and vaginal odor.  No history of HSV-2.  She is not diabetic and continues to deny urinary symptoms.  She has attempted use of over-the-counter antifungal cream without relief of rash/vaginal itching.  Denies recent change in laundry detergent/personal hygiene products.  She would also like a refill of her albuterol inhaler.  States she has a history of asthma and had a viral upper respiratory tract infection recently where she had to borrow  her friend's albuterol inhaler as she misplaced hers.  She does not currently have a primary care provider.  Asthma is usually well-controlled with as needed use of albuterol inhaler.  Denies recent steroid/antibiotic use.  Denies recent asthma exacerbation.  Current every day cigarette smoker without any other drug use.  Denies current cough, viral URI symptoms, and fever/chills.   Back Pain   Past Medical History:  Diagnosis Date   Arthritis    Asthma     There are no problems to display for this patient.   Past Surgical History:  Procedure Laterality Date   CESAREAN SECTION  2010, 2000   INCISION AND DRAINAGE Left 11/17/2019   Procedure: INCISION AND DRAINAGE LEFT LONG FINGER;  Surgeon: Iran Planas, MD;  Location: [facility];  Service: Orthopedics;  Laterality: Left;    OB History   No obstetric history on file.      Home Medications    Prior to Admission medications   Medication Sig Start Date End Date Taking? Authorizing Provider  clotrimazole (LOTRIMIN) 1 % cream Apply to affected area 2 times daily 10/09/22  Yes Marjan Rosman, Stasia Cavalier, FNP  methocarbamol (ROBAXIN) 500 MG tablet Take 1 tablet (500 mg total) by mouth 2 (two) times daily. 10/09/22  Yes Talbot Grumbling, FNP  albuterol (VENTOLIN HFA) 108 (90 Base) MCG/ACT inhaler Inhale 1-2 puffs into the lungs every 6 (six) hours as needed for wheezing or shortness of breath. 10/09/22   Talbot Grumbling, FNP  benzocaine-Menthol (DERMOPLAST) 20-0.5 % AERO Apply 1 application topically 4 (four) times daily as needed for irritation. Patient not taking: Reported on 10/09/2022 07/31/21   Hans Eden, NP  cetirizine (ZYRTEC ALLERGY) 10 MG tablet Take 1 tablet (10 mg total) by mouth daily. Patient not taking: Reported on 07/22/2021 02/28/21   Hughie Closs, PA-C  ibuprofen (ADVIL) 600 MG tablet Take 1 tablet (600 mg total) by mouth every 6 (six) hours as needed. 01/08/21   Pearson Forster, NP   metroNIDAZOLE (FLAGYL) 500 MG tablet Take 1 tablet (500 mg total) by mouth 2 (two) times daily. 10/11/22   Chase Picket, MD  nystatin cream (MYCOSTATIN) Apply to affected area 2 times daily Patient not taking: Reported on 10/09/2022 07/31/21   Hans Eden, NP  predniSONE (DELTASONE) 20 MG tablet Take 2 tablets (40 mg total) by mouth daily. Patient not taking: Reported on 10/09/2022 07/31/21   Hans Eden, NP  tiZANidine (ZANAFLEX) 4 MG tablet Take 1 tablet (4 mg total) by mouth every 6 (six) hours as needed for muscle spasms. Patient not taking: Reported on 10/09/2022 07/22/21   Leonia Reader    Family History Family History  Problem Relation Age of Onset   Healthy Mother     Social History Social History   Tobacco Use   Smoking status: Every Day    Packs/day: .5    Types: Cigarettes   Smokeless tobacco: Never  Vaping Use   Vaping Use: Never used  Substance Use Topics   Alcohol use: Yes    Alcohol/week: 14.0 standard drinks of alcohol    Types: 14 Cans of beer per week   Drug use: Yes    Types: Marijuana     Allergies   Patient has no known allergies.   Review of Systems Review of Systems  Musculoskeletal:  Positive for back pain.  Per HPI   Physical Exam Triage Vital Signs ED Triage Vitals  Enc Vitals Group     BP 10/09/22 0913 116/77     Pulse Rate 10/09/22 0913 74     Resp 10/09/22 0913 18     Temp 10/09/22 0913 98.8 F (37.1 C)     Temp Source 10/09/22 0913 Oral     SpO2 10/09/22 0913 99 %     Weight --      Height --      Head Circumference --      Peak Flow --      Pain Score 10/09/22 0909 8     Pain Loc --      Pain Edu? --      Excl. in [place]? --    No data found.  Updated Vital Signs BP 116/77 (BP Location: Right Arm)   Pulse 74   Temp 98.8 F (37.1 C) (Oral)   Resp 18   LMP 09/09/2022   SpO2 99%   Visual Acuity Right Eye Distance:   Left Eye Distance:   Bilateral Distance:    Right Eye Near:   Left Eye Near:     Bilateral Near:     Physical Exam Vitals and nursing note reviewed. Exam conducted with a chaperone present.  Constitutional:      Appearance: She is not ill-appearing or toxic-appearing.  HENT:     Head: Normocephalic and atraumatic.     Right Ear: Hearing and external ear normal.     Left Ear: Hearing and external ear normal.     Nose: Nose  normal.     Mouth/Throat:     Lips: Pink.     Mouth: Mucous membranes are moist.     Pharynx: No posterior oropharyngeal erythema.  Eyes:     General: Lids are normal. Vision grossly intact. Gaze aligned appropriately.     Extraocular Movements: Extraocular movements intact.     Conjunctiva/sclera: Conjunctivae normal.  Cardiovascular:     Rate and Rhythm: Normal rate and regular rhythm.     Heart sounds: Normal heart sounds, S1 normal and S2 normal.  Pulmonary:     Effort: Pulmonary effort is normal. No respiratory distress.     Breath sounds: Normal breath sounds and air entry.  Genitourinary:    Exam position: Knee-chest position.     Pubic Area: No rash.      Labia:        Right: Rash present. No tenderness, lesion or injury.        Left: Rash present. No tenderness, lesion or injury.      Vagina: Vaginal discharge present.     Comments: Evidence of white vaginal discharge present  Musculoskeletal:     Cervical back: Normal and neck supple.     Thoracic back: Normal.     Lumbar back: Tenderness present. No swelling, edema, deformity, signs of trauma, lacerations, spasms or bony tenderness. Normal range of motion. Negative right straight leg raise test and negative left straight leg raise test. No scoliosis.     Comments: TTP to the left lumbar paraspinals without midline tenderness to the C, T, or L spine. Strength and sensation intact to bilateral lower extremities.   Skin:    General: Skin is warm and dry.     Capillary Refill: Capillary refill takes less than 2 seconds.     Findings: No rash.  Neurological:     General: No  focal deficit present.     Mental Status: She is alert and oriented to person, place, and time. Mental status is at baseline.     Cranial Nerves: No dysarthria or facial asymmetry.  Psychiatric:        Mood and Affect: Mood normal.        Speech: Speech normal.        Behavior: Behavior normal.        Thought Content: Thought content normal.        Judgment: Judgment normal.      UC Treatments / Results  Labs (all labs ordered are listed, but only abnormal results are displayed)   EKG   Radiology No results found.  Procedures Procedures (including critical care time)  Medications Ordered in UC Medications  ketorolac (TORADOL) 30 MG/ML injection 15 mg (15 mg Intramuscular Given 10/09/22 1011)    Initial Impression / Assessment and Plan / UC Course  I have reviewed the triage vital signs and the nursing notes.  Pertinent labs & imaging results that were available during my care of the patient were reviewed by me and considered in my medical decision making (see chart for details).   1. Acute left-sided low back pain without sciatica, muscle strain Presentation is consistent with acute muscle strain of the lumbar spine that will likely resolve with rest, fluids, as needed use of ibuprofen and muscle relaxer, heat, and gentle range of motion exercises. May take ibuprofen OR goody powder as directed and robaxin muscle relaxer every 12 hours as needed for muscle spasm. Drowsiness precautions regarding muscle relaxer use discussed. Ketorolac injection 15mg  IM given due to patient  request as she has experienced relief of low back discomfort with this in the past, no NSAID until tomorrow. Heat and gentle ROM exercises discussed. Deferred imaging today based on stable musculoskeletal exam findings and hemodynamically stable vital signs. Walking referral given to orthopedic provider should symptoms fail to improve in the next 1-2 weeks.   2. Vaginal candidiasis Vaginal irritation  consistent with likely vaginal candidiasis. Vaginal swab pending, will come back in 2-3 days. Will go ahead and treat with diflucan today, then again in 3 days for suspected vaginal yeast infection. Will treat for all other positive test results when swab comes back if indicated.  3. Mild intermittent asthma without status asthmaticus uncomplicated Lungs clear, therefore deferred imaging. Albuterol inhaler sent for as needed use. Strict ER and urgent care return precautions discussed. Advised to follow-up with PCP for ongoing asthma management.   Discussed physical exam and available lab work findings in clinic with patient.  Counseled patient regarding appropriate use of medications and potential side effects for all medications recommended or prescribed today. Discussed red flag signs and symptoms of worsening condition,when to call the PCP office, return to urgent care, and when to seek higher level of care in the emergency department. Patient verbalizes understanding and agreement with plan. All questions answered. Patient discharged in stable condition.    Final Clinical Impressions(s) / UC Diagnoses   Final diagnoses:  Acute left-sided low back pain without sciatica  Muscle strain  Vaginal candidiasis  Mild intermittent asthma without complication     Discharge Instructions      Your vaginal irritation is due to a suspected yeast infection to the vagina. I have sent off a vaginal swab for testing and it will come back in the next 2 to 3 days to make sure that your symptoms are not related to an STD or bacterial vaginosis as well.  Take fluconazole 4 mL (160 mg) today, then again in 3 days to treat vaginal yeast infection.   Apply clotrimazole cream to the vaginal area twice daily for the next 5 to 7 days to treat vaginal yeast infection as well.  I have sent a refill of your albuterol inhaler to the pharmacy to be used as needed.  Your low back pain is likely due to a muscle  spasm.  I gave you a shot of ketorolac in the clinic, therefore you may not have any Goody powder until tomorrow.  Take Robaxin muscle relaxer at bedtime as needed for muscle spasm.  Do not take Robaxin muscle relaxer during the daytime as it can make you very sleepy.  Drink plenty of water to stay well-hydrated.  Perform heat and gentle range of motion exercises to the low back to reduce pain further.   If you develop any new or worsening symptoms or do not improve in the next 2 to 3 days, please return.  If your symptoms are severe, please go to the emergency room.  Follow-up with your primary care provider for further evaluation and management of your symptoms as well as ongoing wellness visits.  I hope you feel better!    ED Prescriptions     Medication Sig Dispense Auth. Provider   fluconazole (DIFLUCAN) 40 MG/ML suspension Take 4 mLs (160 mg total) by mouth daily for 2 doses. 8 mL Joella Prince M, FNP   clotrimazole (LOTRIMIN) 1 % cream Apply to affected area 2 times daily 15 g Joella Prince M, FNP   albuterol (VENTOLIN HFA) 108 (90 Base) MCG/ACT inhaler  Inhale 1-2 puffs into the lungs every 6 (six) hours as needed for wheezing or shortness of breath. 18 g Joella Prince M, FNP   methocarbamol (ROBAXIN) 500 MG tablet Take 1 tablet (500 mg total) by mouth 2 (two) times daily. 20 tablet Talbot Grumbling, FNP      PDMP not reviewed this encounter.   Talbot Grumbling, [place] 10/12/22 1821

## 2022-10-09 NOTE — ED Triage Notes (Addendum)
Back pain started yesterday.  Patient has a history of back spasms.  Denies any injury  Patient took a goody powder .    Patient does not have a primary physician  Reports bumps in vaginal area that itch.   Requesting refill of inhaler

## 2022-10-09 NOTE — Discharge Instructions (Addendum)
Your vaginal irritation is due to a suspected yeast infection to the vagina. I have sent off a vaginal swab for testing and it will come back in the next 2 to 3 days to make sure that your symptoms are not related to an STD or bacterial vaginosis as well.  Take fluconazole 4 mL (160 mg) today, then again in 3 days to treat vaginal yeast infection.   Apply clotrimazole cream to the vaginal area twice daily for the next 5 to 7 days to treat vaginal yeast infection as well.  I have sent a refill of your albuterol inhaler to the pharmacy to be used as needed.  Your low back pain is likely due to a muscle spasm.  I gave you a shot of ketorolac in the clinic, therefore you may not have any Goody powder until tomorrow.  Take Robaxin muscle relaxer at bedtime as needed for muscle spasm.  Do not take Robaxin muscle relaxer during the daytime as it can make you very sleepy.  Drink plenty of water to stay well-hydrated.  Perform heat and gentle range of motion exercises to the low back to reduce pain further.   If you develop any new or worsening symptoms or do not improve in the next 2 to 3 days, please return.  If your symptoms are severe, please go to the emergency room.  Follow-up with your primary care provider for further evaluation and management of your symptoms as well as ongoing wellness visits.  I hope you feel better!

## 2022-10-09 NOTE — ED Notes (Signed)
Instructed to put on gown for physician examination

## 2022-10-10 LAB — CERVICOVAGINAL ANCILLARY ONLY
Bacterial Vaginitis (gardnerella): POSITIVE — AB
Candida Glabrata: NEGATIVE
Candida Vaginitis: NEGATIVE
Chlamydia: NEGATIVE
Comment: NEGATIVE
Comment: NEGATIVE
Comment: NEGATIVE
Comment: NEGATIVE
Comment: NEGATIVE
Comment: NORMAL
Neisseria Gonorrhea: NEGATIVE
Trichomonas: POSITIVE — AB

## 2022-10-11 ENCOUNTER — Telehealth (HOSPITAL_COMMUNITY): Payer: Self-pay | Admitting: Emergency Medicine

## 2022-10-11 MED ORDER — METRONIDAZOLE 500 MG PO TABS: 500.0000 mg | ORAL_TABLET | Freq: Two times a day (BID) | ORAL | 0 refills | Status: AC

## 2023-06-29 ENCOUNTER — Encounter (HOSPITAL_COMMUNITY): Payer: Self-pay | Admitting: Emergency Medicine

## 2023-06-29 ENCOUNTER — Ambulatory Visit (HOSPITAL_COMMUNITY)
Admission: EM | Admit: 2023-06-29 | Discharge: 2023-06-29 | Disposition: A | Discharge status: Home / Self Care | Payer: BC Managed Care – PPO | Attending: Emergency Medicine | Admitting: Emergency Medicine

## 2023-06-29 DIAGNOSIS — R21 Rash and other nonspecific skin eruption: Secondary | ICD-10-CM | POA: Insufficient documentation

## 2023-06-29 DIAGNOSIS — J029 Acute pharyngitis, unspecified: Secondary | ICD-10-CM | POA: Diagnosis not present

## 2023-06-29 DIAGNOSIS — N760 Acute vaginitis: Secondary | ICD-10-CM | POA: Insufficient documentation

## 2023-06-29 LAB — POCT RAPID STREP A (OFFICE): Rapid Strep A Screen: NEGATIVE

## 2023-06-29 LAB — HIV ANTIBODY (ROUTINE TESTING W REFLEX): HIV Screen 4th Generation wRfx: NONREACTIVE

## 2023-06-29 MED ORDER — TRIAMCINOLONE ACETONIDE 0.1 % EX CREA: 1.0000 | TOPICAL_CREAM | Freq: Two times a day (BID) | CUTANEOUS | 0 refills | Status: AC

## 2023-06-29 MED ORDER — PENICILLIN G BENZATHINE 1200000 UNIT/2ML IM SUSY
1.2000 10*6.[IU] | PREFILLED_SYRINGE | Freq: Once | INTRAMUSCULAR | Status: AC
  Administered 2023-06-29: 1.2 10*6.[IU] via INTRAMUSCULAR

## 2023-06-29 MED ORDER — METHYLPREDNISOLONE SODIUM SUCC 125 MG IJ SOLR
INTRAMUSCULAR | Status: AC
  Filled 2023-06-29: qty 2

## 2023-06-29 MED ORDER — METRONIDAZOLE 0.75 % VA GEL: 1.0000 | Freq: Every day | VAGINAL | 0 refills | Status: DC

## 2023-06-29 MED ORDER — METHYLPREDNISOLONE SODIUM SUCC 125 MG IJ SOLR
60.0000 mg | Freq: Once | INTRAMUSCULAR | Status: AC
Start: 2023-06-29 — End: 2023-06-29
  Administered 2023-06-29: 60 mg via INTRAMUSCULAR

## 2023-06-29 MED ORDER — PENICILLIN G BENZATHINE 1200000 UNIT/2ML IM SUSY
PREFILLED_SYRINGE | INTRAMUSCULAR | Status: AC
  Filled 2023-06-29: qty 2

## 2023-06-29 NOTE — ED Provider Notes (Signed)
[facility]    CSN: [id number] Arrival date & time: 06/29/23  1129      History   Chief Complaint No chief complaint on file.   HPI Stephanie Richards is a 47 y.o. female.   Patient presents to clinic for multiple complaints.  She has a itchy rash to her abdomen and her left shoulder.  It is dry and scaly.  Reports it feels like she is breaking out and thinks she may have eczema.  This has been ongoing for the past few months.  She has not tried anything for the rash.  She is also having a a lot of vaginal itching.  She was unable to complete her metronidazole that was prescribed back in March for bacterial vaginosis and trichomoniasis.  Reports she has not been sexually active since this visit.  She has had continued vaginal itching since March.  Over the past 2 days she has developed a sore throat.  She is having trouble swallowing due to the pain in her throat.  She has a history of frequent strep infections and was told that she should get her tonsils removed.  She denies any fevers.  No cough.  No shortness of breath or wheezing.  She has not tried anything for her sore throat.   The history is provided by the patient and medical records.    Past Medical History:  Diagnosis Date   Arthritis    Asthma     There are no problems to display for this patient.   Past Surgical History:  Procedure Laterality Date   CESAREAN SECTION  2010, 2000   INCISION AND DRAINAGE Left 11/17/2019   Procedure: INCISION AND DRAINAGE LEFT LONG FINGER;  Surgeon: Bradly Bienenstock, MD;  Location: [facility];  Service: Orthopedics;  Laterality: Left;    OB History   No obstetric history on file.      Home Medications    Prior to Admission medications   Medication Sig Start Date End Date Taking? Authorizing Provider  metroNIDAZOLE (METROGEL) 0.75 % vaginal gel Place 1 Applicatorful vaginally at bedtime for 7 days. 06/29/23 07/06/23 Yes Rinaldo Ratel, [place] N, FNP   triamcinolone cream (KENALOG) 0.1 % Apply 1 Application topically 2 (two) times daily. 06/29/23  Yes Rinaldo Ratel, [place] N, FNP  albuterol (VENTOLIN HFA) 108 (90 Base) MCG/ACT inhaler Inhale 1-2 puffs into the lungs every 6 (six) hours as needed for wheezing or shortness of breath. 10/09/22   Carlisle Beers, FNP  benzocaine-Menthol (DERMOPLAST) 20-0.5 % AERO Apply 1 application topically 4 (four) times daily as needed for irritation. Patient not taking: Reported on 10/09/2022 07/31/21   Valinda Hoar, NP  cetirizine (ZYRTEC ALLERGY) 10 MG tablet Take 1 tablet (10 mg total) by mouth daily. Patient not taking: Reported on 07/22/2021 02/28/21   Rushie Chestnut, PA-C  clotrimazole (LOTRIMIN) 1 % cream Apply to affected area 2 times daily 10/09/22   Carlisle Beers, FNP  ibuprofen (ADVIL) 600 MG tablet Take 1 tablet (600 mg total) by mouth every 6 (six) hours as needed. 01/08/21   Ivette Loyal, NP  methocarbamol (ROBAXIN) 500 MG tablet Take 1 tablet (500 mg total) by mouth 2 (two) times daily. Patient not taking: Reported on 06/29/2023 10/09/22   Carlisle Beers, FNP  metroNIDAZOLE (FLAGYL) 500 MG tablet Take 1 tablet (500 mg total) by mouth 2 (two) times daily. Patient not taking: Reported on 06/29/2023 10/11/22   Merrilee Jansky, MD  nystatin cream (  MYCOSTATIN) Apply to affected area 2 times daily Patient not taking: Reported on 10/09/2022 07/31/21   Valinda Hoar, NP  predniSONE (DELTASONE) 20 MG tablet Take 2 tablets (40 mg total) by mouth daily. Patient not taking: Reported on 10/09/2022 07/31/21   Valinda Hoar, NP  tiZANidine (ZANAFLEX) 4 MG tablet Take 1 tablet (4 mg total) by mouth every 6 (six) hours as needed for muscle spasms. Patient not taking: Reported on 10/09/2022 07/22/21   Samuella Cota    Family History Family History  Problem Relation Age of Onset   Healthy Mother     Social History Social History   Tobacco Use   Smoking status: Every Day     Current packs/day: 0.50    Types: Cigarettes   Smokeless tobacco: Never  Vaping Use   Vaping status: Never Used  Substance Use Topics   Alcohol use: Yes    Alcohol/week: 14.0 standard drinks of alcohol    Types: 14 Cans of beer per week   Drug use: Yes    Types: Marijuana     Allergies   Patient has no known allergies.   Review of Systems Review of Systems  Per HPI   Physical Exam Triage Vital Signs ED Triage Vitals  Encounter Vitals Group     BP 06/29/23 1210 135/82     Systolic BP Percentile --      Diastolic BP Percentile --      Pulse Rate 06/29/23 1210 93     Resp 06/29/23 1210 17     Temp 06/29/23 1210 98.4 F (36.9 C)     Temp Source 06/29/23 1210 Oral     SpO2 06/29/23 1210 95 %     Weight --      Height --      Head Circumference --      Peak Flow --      Pain Score 06/29/23 1209 8     Pain Loc --      Pain Education --      Exclude from Growth Chart --    No data found.  Updated Vital Signs BP 135/82 (BP Location: Left Arm)   Pulse 93   Temp 98.4 F (36.9 C) (Oral)   Resp 17   LMP 06/22/2023 (Approximate)   SpO2 95%   Visual Acuity Right Eye Distance:   Left Eye Distance:   Bilateral Distance:    Right Eye Near:   Left Eye Near:    Bilateral Near:     Physical Exam Vitals and nursing note reviewed.  Constitutional:      Appearance: Normal appearance.  HENT:     Head: Normocephalic and atraumatic.     Right Ear: External ear normal.     Left Ear: External ear normal.     Nose: Nose normal.     Mouth/Throat:     Mouth: Mucous membranes are moist.     Pharynx: Uvula midline.     Tonsils: Tonsillar exudate present. 3+ on the right. 3+ on the left.     Comments: Bilateral tonsil exudate and 3+ edema.  Positive for voice change and trismus. Eyes:     Conjunctiva/sclera: Conjunctivae normal.  Cardiovascular:     Rate and Rhythm: Normal rate and regular rhythm.     Heart sounds: Normal heart sounds. No murmur heard. Pulmonary:      Effort: No respiratory distress.     Breath sounds: Normal breath sounds.  Musculoskeletal:  General: Normal range of motion.     Cervical back: Normal range of motion.  Skin:    General: Skin is warm and dry.     Findings: Rash present. Rash is scaling and urticarial.          Comments: Itchy, urticarial dry scaling rash to the right abdomen and left chest/shoulder area.  Neurological:     General: No focal deficit present.     Mental Status: She is alert.  Psychiatric:        Mood and Affect: Mood normal.        Behavior: Behavior is cooperative.      UC Treatments / Results  Labs (all labs ordered are listed, but only abnormal results are displayed) Labs Reviewed  RPR  HIV ANTIBODY (ROUTINE TESTING W REFLEX)  POCT RAPID STREP A (OFFICE)  CERVICOVAGINAL ANCILLARY ONLY    EKG   Radiology No results found.  Procedures Procedures (including critical care time)  Medications Ordered in UC Medications  methylPREDNISolone sodium succinate (SOLU-MEDROL) 125 mg/2 mL injection 60 mg (has no administration in time range)  penicillin g benzathine (BICILLIN LA) 1200000 UNIT/2ML injection 1.2 Million Units (has no administration in time range)    Initial Impression / Assessment and Plan / UC Course  I have reviewed the triage vital signs and the nursing notes.  Pertinent labs & imaging results that were available during my care of the patient were reviewed by me and considered in my medical decision making (see chart for details).  Vitals and triage reviewed, patient is hemodynamically stable.  She is able to speak in full sentences.  She does have significant tonsillar edema and exudate.  Rapid strep was negative, however, was unable to obtain an adequate sample due to pain/trismus.  Patient also has positive voice change.  Will cover in clinic with IM steroid and penicillin for tonsillitis/strep throat.  Itchy rash to multiple areas.  IM steroid will help with this.   Prescribed triamcinolone for this as well.  Vaginitis persist since March.  Patient reports she has a hard time swallowing pills at baseline, will prescribe the Metro[employer] applicator to treat presumed BV and trichomoniasis since these were not adequately treated at last visit.  Cytology swab obtained.  HIV and syphilis screening also obtained.  Plan of care, follow-up care and return precautions given, no questions at this time.     Final Clinical Impressions(s) / UC Diagnoses   Final diagnoses:  Acute pharyngitis, unspecified etiology  Rash and nonspecific skin eruption  Acute vaginitis     Discharge Instructions      We have given you an injection in clinic to help with your itching, this was a steroid.  I am treating you for presumed strep infection with the penicillin shot.  Your oral swelling should improve with these medications as well.  You can drink tea with honey, do warm saline gargles and eat cold foods like popsicles and soft foods like mashed potatoes.  I am treating you for vaginal infection with the MetroGel vaginal applicators to use at bedtime for the next 7 days.  Our staff will contact you if additional treatment is needed based on your lab results and cytology swab.  Please return to clinic for any new or urgent symptoms.      ED Prescriptions     Medication Sig Dispense Auth. Provider   triamcinolone cream (KENALOG) 0.1 % Apply 1 Application topically 2 (two) times daily. 30 g Rorey Hodges, [place], [place]  metroNIDAZOLE (METROGEL) 0.75 % vaginal gel Place 1 Applicatorful vaginally at bedtime for 7 days. 70 g Latunya Kissick, [place] N, [place]      PDMP not reviewed this encounter.   Emmamae Mcnamara, [place] N, [place] 06/29/23 [phone]

## 2023-06-29 NOTE — ED Triage Notes (Addendum)
Pt c/o swollen tonsils and also rash on abdomen and shoulder for 2 days  She also has vaginal itching and irritation. States she was given medication for infection but was not able to complete course.   Pt also noticed right pointer finger is swollen and painful this morning

## 2023-06-29 NOTE — Discharge Instructions (Addendum)
We have given you an injection in clinic to help with your itching, this was a steroid.  I am treating you for presumed strep infection with the penicillin shot.  Your oral swelling should improve with these medications as well.  You can drink tea with honey, do warm saline gargles and eat cold foods like popsicles and soft foods like mashed potatoes.  I am treating you for vaginal infection with the [employer] vaginal applicators to use at bedtime for the next 7 days.  Our staff will contact you if additional treatment is needed based on your lab results and cytology swab.  Please return to clinic for any new or urgent symptoms.

## 2023-06-30 ENCOUNTER — Encounter (HOSPITAL_COMMUNITY): Payer: Self-pay

## 2023-06-30 ENCOUNTER — Ambulatory Visit (HOSPITAL_COMMUNITY)
Admission: EM | Admit: 2023-06-30 | Discharge: 2023-06-30 | Disposition: A | Discharge status: Home / Self Care | Payer: BC Managed Care – PPO | Attending: Emergency Medicine | Admitting: Emergency Medicine

## 2023-06-30 ENCOUNTER — Telehealth (HOSPITAL_COMMUNITY): Payer: Self-pay

## 2023-06-30 DIAGNOSIS — M79644 Pain in right finger(s): Secondary | ICD-10-CM

## 2023-06-30 DIAGNOSIS — L03011 Cellulitis of right finger: Secondary | ICD-10-CM | POA: Diagnosis not present

## 2023-06-30 LAB — CERVICOVAGINAL ANCILLARY ONLY
Bacterial Vaginitis (gardnerella): POSITIVE — AB
Candida Glabrata: NEGATIVE
Candida Vaginitis: NEGATIVE
Chlamydia: NEGATIVE
Comment: NEGATIVE
Comment: NEGATIVE
Comment: NEGATIVE
Comment: NEGATIVE
Comment: NEGATIVE
Comment: NORMAL
Neisseria Gonorrhea: NEGATIVE
Trichomonas: POSITIVE — AB

## 2023-06-30 LAB — RPR: RPR Ser Ql: NONREACTIVE

## 2023-06-30 MED ORDER — CEPHALEXIN 500 MG PO CAPS: 500.0000 mg | ORAL_CAPSULE | Freq: Four times a day (QID) | ORAL | 0 refills | Status: AC

## 2023-06-30 MED ORDER — IBUPROFEN 800 MG PO TABS
800.0000 mg | ORAL_TABLET | Freq: Once | ORAL | Status: AC
Start: 2023-06-30 — End: 2023-06-30
  Administered 2023-06-30: 800 mg via ORAL

## 2023-06-30 MED ORDER — IBUPROFEN 800 MG PO TABS
ORAL_TABLET | ORAL | Status: AC
  Filled 2023-06-30: qty 1

## 2023-06-30 MED ORDER — IBUPROFEN 800 MG PO TABS: 800.0000 mg | ORAL_TABLET | Freq: Three times a day (TID) | ORAL | 0 refills | Status: AC

## 2023-06-30 MED ORDER — METRONIDAZOLE 500 MG PO TABS: 500.0000 mg | ORAL_TABLET | Freq: Two times a day (BID) | ORAL | 0 refills | Status: AC

## 2023-06-30 NOTE — Telephone Encounter (Signed)
Per protocol, pt requires tx with metronidazole PO.  Reviewed with patient, verified pharmacy, prescription sent.

## 2023-06-30 NOTE — Discharge Instructions (Signed)
You have an infection of your fingertip.  Please take the oral antibiotics as prescribed and with food.  You can take 800 mg of ibuprofen every 8 hours as needed for pain and inflammation.  Heat to the area may help.  If no improvement or anything worsens, please seek follow-up at the emergency department for further evaluation.

## 2023-06-30 NOTE — ED Provider Notes (Signed)
[facility]    CSN: [id number] Arrival date & time: 06/30/23  1808      History   Chief Complaint Chief Complaint  Patient presents with   Finger Injury    HPI Stephanie Richards is a 47 y.o. female.   Patient presents to clinic for right index finger pain.  She has noticed swelling at the right finger pad area.  She had pain the past few days, but the pain and swelling woke her from sleep last night.  She feels like the area is pulsating.  She has not tried anything for the pain.  She denies any injuries to the finger.  She does not bite her nails.  She has not had any fevers.  The history is provided by the patient and medical records.    Past Medical History:  Diagnosis Date   Arthritis    Asthma     There are no problems to display for this patient.   Past Surgical History:  Procedure Laterality Date   CESAREAN SECTION  2010, 2000   INCISION AND DRAINAGE Left 11/17/2019   Procedure: INCISION AND DRAINAGE LEFT LONG FINGER;  Surgeon: Bradly Bienenstock, MD;  Location: [facility];  Service: Orthopedics;  Laterality: Left;    OB History   No obstetric history on file.      Home Medications    Prior to Admission medications   Medication Sig Start Date End Date Taking? Authorizing Provider  albuterol (VENTOLIN HFA) 108 (90 Base) MCG/ACT inhaler Inhale 1-2 puffs into the lungs every 6 (six) hours as needed for wheezing or shortness of breath. 10/09/22  Yes Stanhope, Donavan Burnet, FNP  cephALEXin (KEFLEX) 500 MG capsule Take 1 capsule (500 mg total) by mouth 4 (four) times daily. 06/30/23  Yes Rinaldo Ratel, [place], FNP  ibuprofen (ADVIL) 800 MG tablet Take 1 tablet (800 mg total) by mouth 3 (three) times daily. 06/30/23  Yes Rinaldo Ratel, [place], FNP  metroNIDAZOLE (FLAGYL) 500 MG tablet Take 1 tablet (500 mg total) by mouth 2 (two) times daily for 7 days. 06/30/23 07/07/23 Yes Lamptey, Britta Mccreedy, MD  triamcinolone cream (KENALOG) 0.1 % Apply 1  Application topically 2 (two) times daily. 06/29/23  Yes Rinaldo Ratel, [place], FNP  metroNIDAZOLE (FLAGYL) 500 MG tablet Take 1 tablet (500 mg total) by mouth 2 (two) times daily. Patient not taking: Reported on 06/29/2023 10/11/22   Lamptey, Britta Mccreedy, MD    Family History Family History  Problem Relation Age of Onset   Healthy Mother     Social History Social History   Tobacco Use   Smoking status: Every Day    Current packs/day: 0.50    Types: Cigarettes   Smokeless tobacco: Never  Vaping Use   Vaping status: Never Used  Substance Use Topics   Alcohol use: Yes    Alcohol/week: 14.0 standard drinks of alcohol    Types: 14 Cans of beer per week   Drug use: Yes    Types: Marijuana     Allergies   Patient has no known allergies.   Review of Systems Review of Systems  Per HPI   Physical Exam Triage Vital Signs ED Triage Vitals  Encounter Vitals Group     BP 06/30/23 1904 130/89     Systolic BP Percentile --      Diastolic BP Percentile --      Pulse Rate 06/30/23 1904 89     Resp 06/30/23 1904 18  Temp 06/30/23 1904 98.4 F (36.9 C)     Temp Source 06/30/23 1904 Oral     SpO2 06/30/23 1904 98 %     Weight --      Height --      Head Circumference --      Peak Flow --      Pain Score 06/30/23 1908 5     Pain Loc --      Pain Education --      Exclude from Growth Chart --    No data found.  Updated Vital Signs BP 130/89 (BP Location: Left Arm)   Pulse 89   Temp 98.4 F (36.9 C) (Oral)   Resp 18   LMP 06/22/2023 (Approximate)   SpO2 98%   Visual Acuity Right Eye Distance:   Left Eye Distance:   Bilateral Distance:    Right Eye Near:   Left Eye Near:    Bilateral Near:     Physical Exam Vitals and nursing note reviewed.  Constitutional:      Appearance: Normal appearance.  HENT:     Head: Normocephalic and atraumatic.     Right Ear: External ear normal.     Left Ear: External ear normal.     Nose: Nose normal.     Mouth/Throat:      Mouth: Mucous membranes are moist.  Eyes:     Conjunctiva/sclera: Conjunctivae normal.  Cardiovascular:     Rate and Rhythm: Normal rate.     Pulses: Normal pulses.  Pulmonary:     Effort: Pulmonary effort is normal. No respiratory distress.  Musculoskeletal:        General: Normal range of motion.  Skin:    General: Skin is warm and dry.     Capillary Refill: Capillary refill takes less than 2 seconds.     Comments: Swelling and tenderness over distal right index finger along finger pad.   Neurological:     General: No focal deficit present.     Mental Status: She is alert.  Psychiatric:        Mood and Affect: Mood normal.        Behavior: Behavior is cooperative.     Media Information       UC Treatments / Results  Labs (all labs ordered are listed, but only abnormal results are displayed) Labs Reviewed - No data to display  EKG   Radiology No results found.  Procedures Procedures (including critical care time)  Medications Ordered in UC Medications  ibuprofen (ADVIL) tablet 800 mg (800 mg Oral Given 06/30/23 1941)    Initial Impression / Assessment and Plan / UC Course  I have reviewed the triage vital signs and the nursing notes.  Pertinent labs & imaging results that were available during my care of the patient were reviewed by me and considered in my medical decision making (see chart for details).  Vitals and triage reviewed, patient is hemodynamically stable.  Has a blisterlike area to her right index finger pad.  Suspicious for felon.  There is erythema below the finger pad, will cover for cellulitis with Keflex.  Ibuprofen given in clinic for pain and inflammation.  Strict emergency precautions given.  Patient verbalized that she is in a lot of pain and would like drainage of the area, discussed that she may benefit from further evaluation at the emergency department as she may require surgical intervention, especially if no improvement over the next 2  to 3 days.  Patient verbalized  understanding, no questions at this time.     Final Clinical Impressions(s) / UC Diagnoses   Final diagnoses:  Pain of finger of right hand  Cellulitis of finger of right hand     Discharge Instructions      You have an infection of your fingertip.  Please take the oral antibiotics as prescribed and with food.  You can take 800 mg of ibuprofen every 8 hours as needed for pain and inflammation.  Heat to the area may help.  If no improvement or anything worsens, please seek follow-up at the emergency department for further evaluation.    ED Prescriptions     Medication Sig Dispense Auth. Provider   cephALEXin (KEFLEX) 500 MG capsule Take 1 capsule (500 mg total) by mouth 4 (four) times daily. 20 capsule Rinaldo Ratel, [place], [place]   ibuprofen (ADVIL) 800 MG tablet Take 1 tablet (800 mg total) by mouth 3 (three) times daily. 21 tablet Kennya Schwenn, [place], [place]      PDMP not reviewed this encounter.   Jodilyn Giese, [place] N, [place] 06/30/23 1946

## 2023-06-30 NOTE — ED Triage Notes (Signed)
Pt reports right pointer finger is infected. Denies any recent injuries to her finger.
